# Patient Record
Sex: Female | Born: 1980 | Race: White | Hispanic: No | Marital: Married | Smoking: Former smoker
Health system: Southern US, Community
[De-identification: ages and names within clinical notes are randomized; demographics above are authoritative.]

## PROBLEM LIST (undated history)

## (undated) DIAGNOSIS — T753XXA Motion sickness, initial encounter: Secondary | ICD-10-CM

## (undated) DIAGNOSIS — J45909 Unspecified asthma, uncomplicated: Secondary | ICD-10-CM

## (undated) DIAGNOSIS — Z1589 Genetic susceptibility to other disease: Secondary | ICD-10-CM

## (undated) DIAGNOSIS — Z808 Family history of malignant neoplasm of other organs or systems: Secondary | ICD-10-CM

## (undated) DIAGNOSIS — Z803 Family history of malignant neoplasm of breast: Secondary | ICD-10-CM

## (undated) DIAGNOSIS — D682 Hereditary deficiency of other clotting factors: Secondary | ICD-10-CM

## (undated) DIAGNOSIS — E559 Vitamin D deficiency, unspecified: Secondary | ICD-10-CM

## (undated) HISTORY — PX: LAPAROSCOPIC ABDOMINAL EXPLORATION: SHX6249

## (undated) HISTORY — DX: Vitamin D deficiency, unspecified: E55.9

## (undated) HISTORY — DX: Family history of malignant neoplasm of other organs or systems: Z80.8

## (undated) HISTORY — PX: DILATION AND CURETTAGE OF UTERUS: SHX78

## (undated) HISTORY — DX: Family history of malignant neoplasm of breast: Z80.3

## (undated) HISTORY — PX: WISDOM TOOTH EXTRACTION: SHX21

## (undated) HISTORY — DX: Hereditary deficiency of other clotting factors: D68.2

---
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
## 2004-11-10 ENCOUNTER — Ambulatory Visit: Payer: Self-pay | Admitting: Internal Medicine

## 2008-08-18 ENCOUNTER — Emergency Department: Payer: Self-pay | Admitting: Emergency Medicine

## 2011-03-17 ENCOUNTER — Ambulatory Visit: Payer: Self-pay | Admitting: Unknown Physician Specialty

## 2011-03-21 ENCOUNTER — Ambulatory Visit: Payer: Self-pay | Admitting: Urology

## 2011-03-23 LAB — PATHOLOGY REPORT

## 2012-11-14 ENCOUNTER — Emergency Department: Payer: Self-pay | Admitting: Emergency Medicine

## 2012-11-14 LAB — URINALYSIS, COMPLETE
Bilirubin,UR: NEGATIVE
Blood: NEGATIVE
Glucose,UR: NEGATIVE mg/dL (ref 0–75)
Ketone: NEGATIVE
Leukocyte Esterase: NEGATIVE
Nitrite: NEGATIVE
Ph: 6 (ref 4.5–8.0)
Protein: NEGATIVE
RBC,UR: 1 /HPF (ref 0–5)
Specific Gravity: 1.019 (ref 1.003–1.030)
Squamous Epithelial: 1
WBC UR: 2 /HPF (ref 0–5)

## 2012-11-14 LAB — COMPREHENSIVE METABOLIC PANEL
Albumin: 3.9 g/dL (ref 3.4–5.0)
Alkaline Phosphatase: 86 U/L (ref 50–136)
Anion Gap: 5 — ABNORMAL LOW (ref 7–16)
BUN: 10 mg/dL (ref 7–18)
Bilirubin,Total: 0.3 mg/dL (ref 0.2–1.0)
Calcium, Total: 8.7 mg/dL (ref 8.5–10.1)
Chloride: 107 mmol/L (ref 98–107)
Co2: 29 mmol/L (ref 21–32)
Creatinine: 1.02 mg/dL (ref 0.60–1.30)
EGFR (African American): >60
EGFR (Non-African Amer.): >60
Glucose: 102 mg/dL — ABNORMAL HIGH (ref 65–99)
Osmolality: 280 (ref 275–301)
Potassium: 3.6 mmol/L (ref 3.5–5.1)
SGOT(AST): 26 U/L (ref 15–37)
SGPT (ALT): 32 U/L (ref 12–78)
Sodium: 141 mmol/L (ref 136–145)
Total Protein: 6.8 g/dL (ref 6.4–8.2)

## 2012-11-14 LAB — CBC
HCT: 38.8 % (ref 35.0–47.0)
HGB: 13.6 g/dL (ref 12.0–16.0)
MCH: 30.3 pg (ref 26.0–34.0)
MCHC: 35 g/dL (ref 32.0–36.0)
MCV: 87 fL (ref 80–100)
Platelet: 199 10*3/uL (ref 150–440)
RBC: 4.48 10*6/uL (ref 3.80–5.20)
RDW: 13 % (ref 11.5–14.5)
WBC: 10.7 10*3/uL (ref 3.6–11.0)

## 2014-04-24 ENCOUNTER — Observation Stay: Payer: Self-pay | Admitting: Obstetrics and Gynecology

## 2014-06-02 ENCOUNTER — Inpatient Hospital Stay: Payer: Self-pay

## 2014-06-02 LAB — CBC WITH DIFFERENTIAL/PLATELET
Basophil #: 0 10*3/uL (ref 0.0–0.1)
Basophil %: 0.3 %
Eosinophil #: 0 10*3/uL (ref 0.0–0.7)
Eosinophil %: 0.2 %
HCT: 33.2 % — ABNORMAL LOW (ref 35.0–47.0)
HGB: 10.6 g/dL — ABNORMAL LOW (ref 12.0–16.0)
Lymphocyte #: 1.1 10*3/uL (ref 1.0–3.6)
Lymphocyte %: 9.6 %
MCH: 27.9 pg (ref 26.0–34.0)
MCHC: 31.9 g/dL — ABNORMAL LOW (ref 32.0–36.0)
MCV: 88 fL (ref 80–100)
Monocyte #: 0.3 x10 3/mm (ref 0.2–0.9)
Monocyte %: 2.9 %
Neutrophil #: 10.2 10*3/uL — ABNORMAL HIGH (ref 1.4–6.5)
Neutrophil %: 87 %
Platelet: 136 10*3/uL — ABNORMAL LOW (ref 150–440)
RBC: 3.79 10*6/uL — ABNORMAL LOW (ref 3.80–5.20)
RDW: 14.9 % — ABNORMAL HIGH (ref 11.5–14.5)
WBC: 11.8 10*3/uL — ABNORMAL HIGH (ref 3.6–11.0)

## 2014-06-03 LAB — CBC WITH DIFFERENTIAL/PLATELET
Basophil #: 0 10*3/uL (ref 0.0–0.1)
Basophil %: 0.2 %
Eosinophil #: 0 10*3/uL (ref 0.0–0.7)
Eosinophil %: 0.1 %
HCT: 32.1 % — ABNORMAL LOW (ref 35.0–47.0)
HGB: 10.4 g/dL — ABNORMAL LOW (ref 12.0–16.0)
Lymphocyte #: 1 10*3/uL (ref 1.0–3.6)
Lymphocyte %: 6.9 %
MCH: 27.7 pg (ref 26.0–34.0)
MCHC: 32.4 g/dL (ref 32.0–36.0)
MCV: 86 fL (ref 80–100)
Monocyte #: 0.6 x10 3/mm (ref 0.2–0.9)
Monocyte %: 4.3 %
Neutrophil #: 13.2 10*3/uL — ABNORMAL HIGH (ref 1.4–6.5)
Neutrophil %: 88.5 %
Platelet: 131 10*3/uL — ABNORMAL LOW (ref 150–440)
RBC: 3.75 10*6/uL — ABNORMAL LOW (ref 3.80–5.20)
RDW: 14.9 % — ABNORMAL HIGH (ref 11.5–14.5)
WBC: 14.9 10*3/uL — ABNORMAL HIGH (ref 3.6–11.0)

## 2014-06-04 LAB — HEMOGLOBIN: HGB: 8.9 g/dL — ABNORMAL LOW (ref 12.0–16.0)

## 2014-08-11 IMAGING — CT CT HEAD WITHOUT CONTRAST
2 series · 16 of 30 positions shown, 20 images · non-contrast
Comparison: none

REASON FOR EXAM: slurred speech
COMMENTS:   LMP: > one month ago

PROCEDURE:     CT  - CT HEAD WITHOUT CONTRAST  - November 15, 2012  [DATE]
RESULT:     Comparison:  None
TECHNIQUE: Multiple axial images from the foramen magnum to the vertex were
obtained without IV contrast.

[Series 2: without · axial · non-contrast · 0.40mm/px · z∈[-62,+63]mm · 13 of 31 slices shown, 17 images]
[im 3/31  brain]
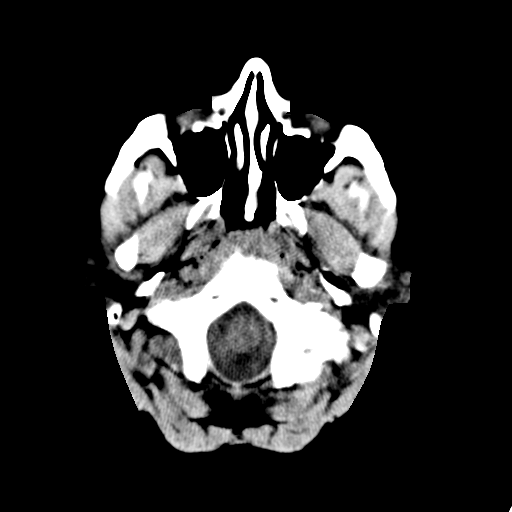
[im 3/31  bone]
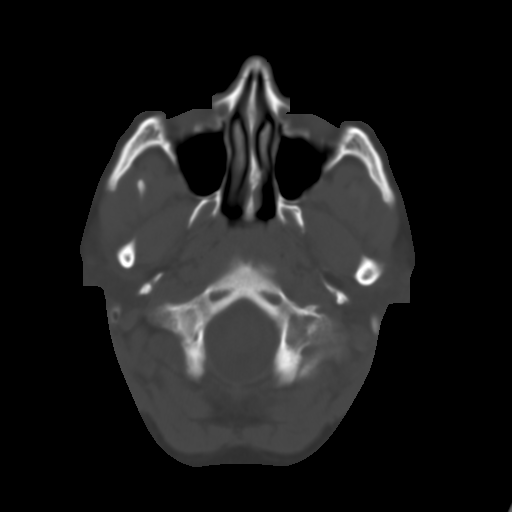
[im 5/31  brain]
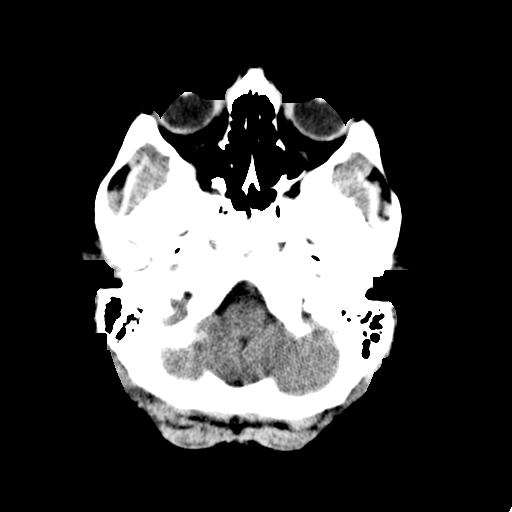
[im 7/31  brain]
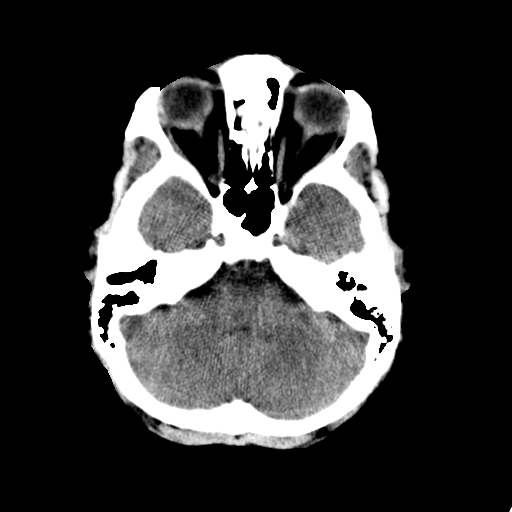
[im 9/31  brain]
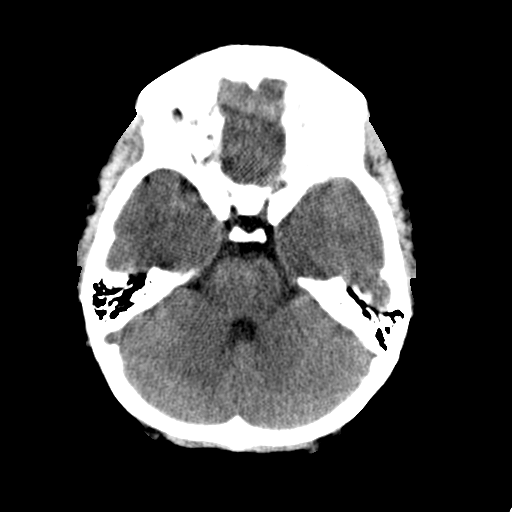
[im 11/31  brain]
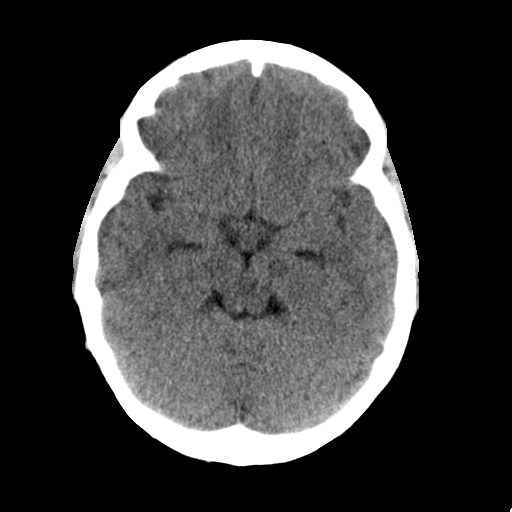
[im 11/31  bone]
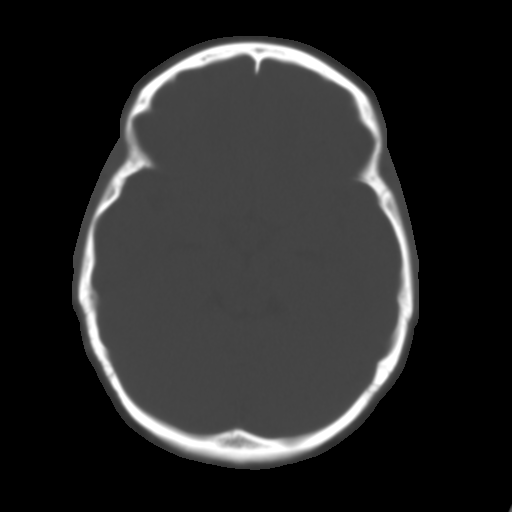
[im 13/31  brain]
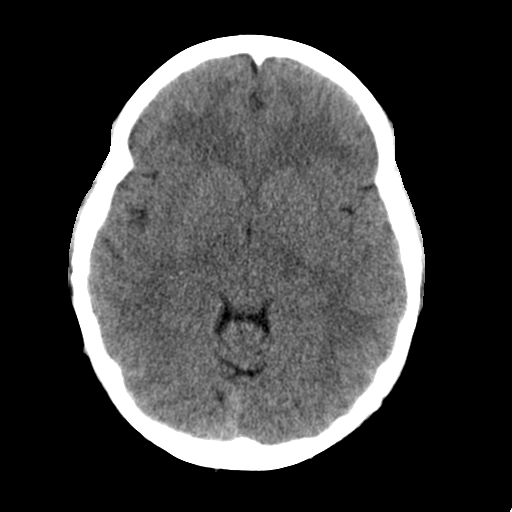
[im 16/31  brain]
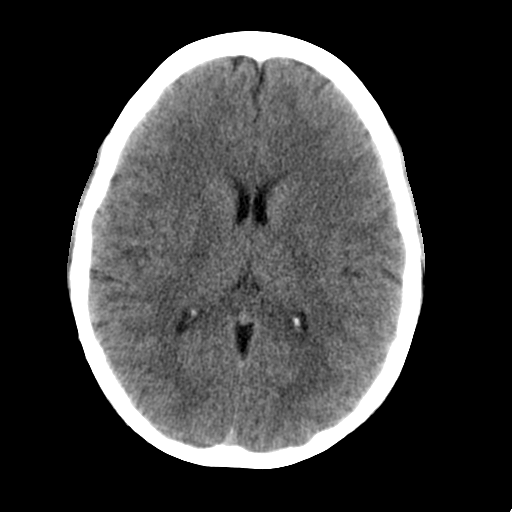
[im 18/31  brain]
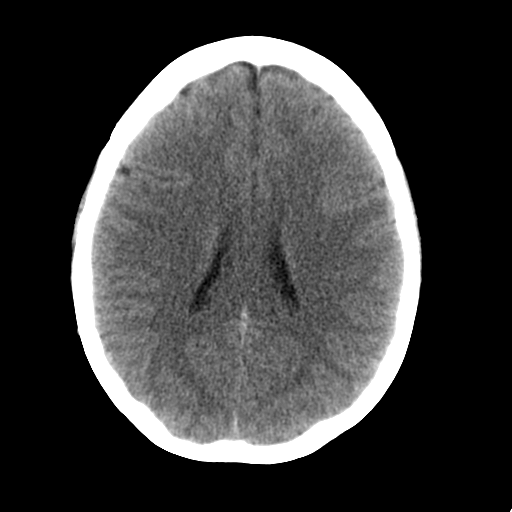
[im 20/31  brain]
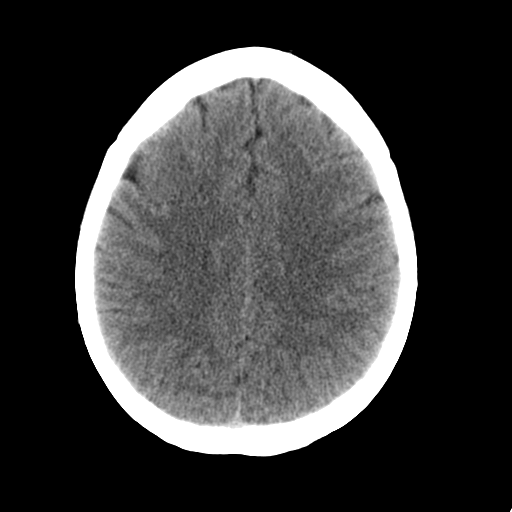
[im 20/31  bone]
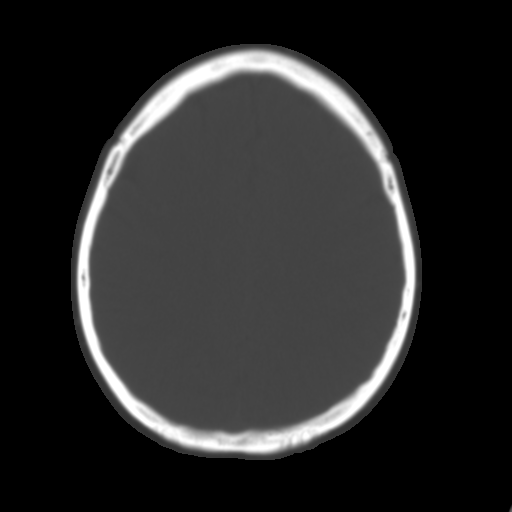
[im 22/31  brain]
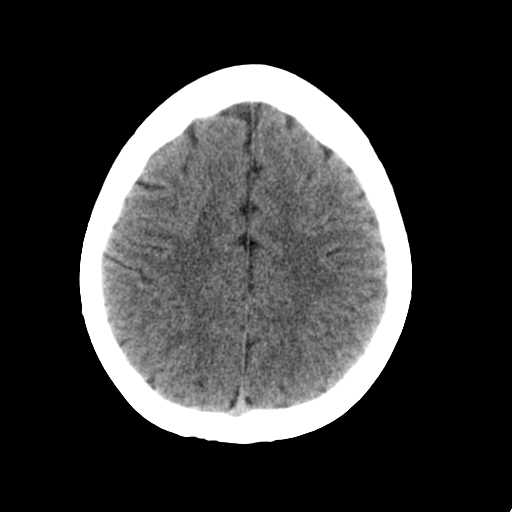
[im 24/31  brain]
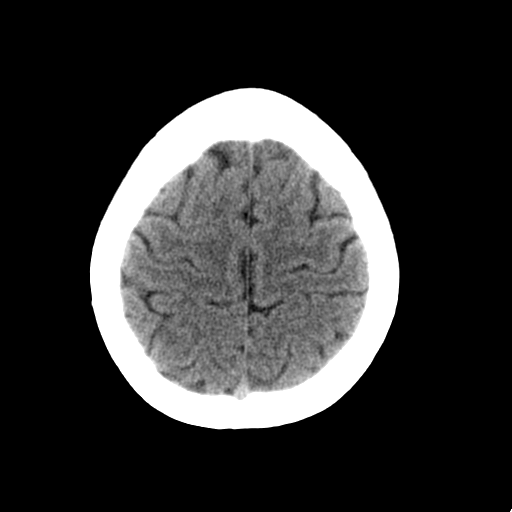
[im 26/31  brain]
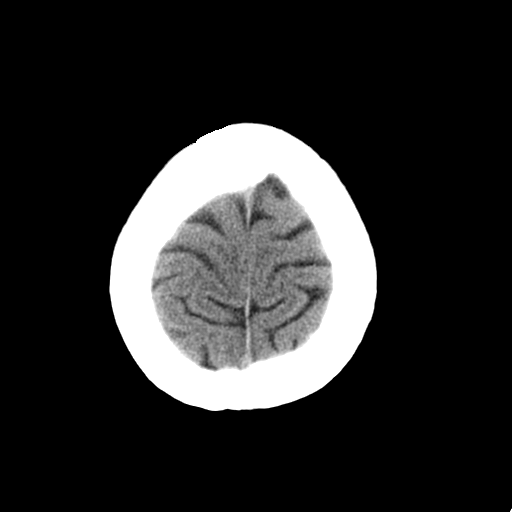
[im 28/31  brain]
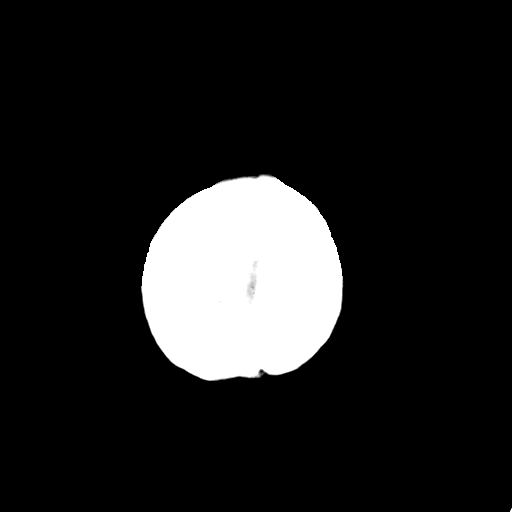
[im 28/31  bone]
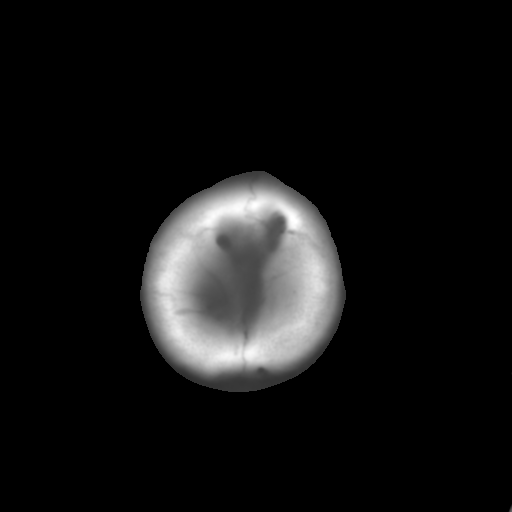

[Series 3: bone · axial · 0.40mm/px · z∈[-62,-22]mm · 3 of 31 slices shown]
[im 3/31  bone]
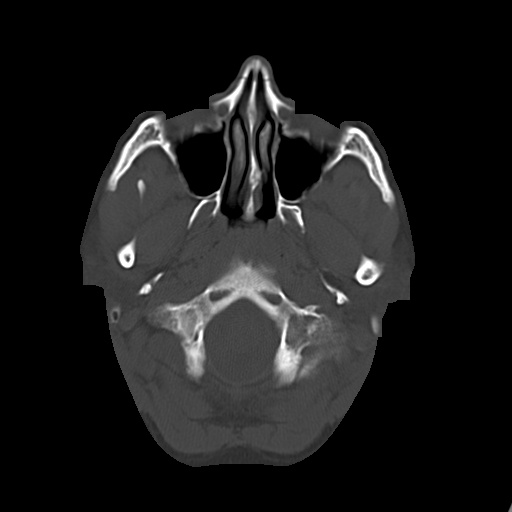
[im 7/31  bone]
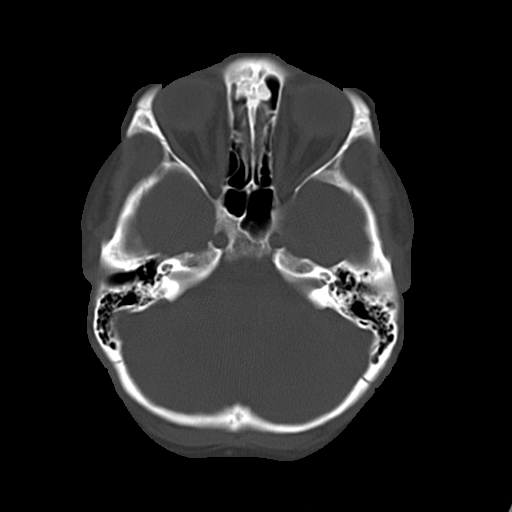
[im 11/31  bone]
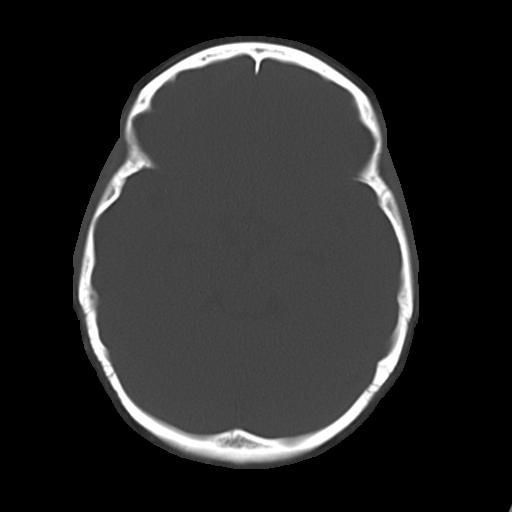

[16 of 30 positions shown; findings below may reference images not displayed]

FINDINGS: There is no evidence of mass effect, midline shift, or extra-axial fluid
collections.  There is no evidence of a space-occupying lesion or
intracranial hemorrhage. There is no evidence of a cortical-based area of
acute infarction.

The ventricles and sulci are appropriate for the patient's age. The basal
cisterns are patent.

Visualized portions of the orbits are unremarkable. The visualized portions
of the paranasal sinuses and mastoid air cells are unremarkable.

The osseous structures are unremarkable.
IMPRESSION: No acute intracranial process.

[REDACTED]

## 2014-09-20 NOTE — Op Note (Signed)
PATIENT NAME:  Joyce Lee, Joyce Lee MR#:  [id number] DATE OF BIRTH:  Aug 25, 1980  DATE OF PROCEDURE:  06/03/2014  PREOPERATIVE DIAGNOSES:  1. Intrauterine pregnancy at term.  2. Active labor.  3. Group B streptococcus negative.  4. Meconium-stained fluid.   POSTOPERATIVE DIAGNOSES: 1. Intrauterine pregnancy at term.  2. Active labor.  3. Group B streptococcus negative.  4. Meconium-stained fluid.  5. Delivery of viable female infant.  6. Mild postpartum hemorrhage with an estimated blood loss of 500 mL.   7. Retained fragment of placenta that was manually extracted at the bedside.  8. Occiput posterior position.   SURGEON: Cline CoolsBethany E. Andon Villard, MD.  ANESTHESIA:  Epidural.    COMPLICATIONS: Postpartum hemorrhage with estimated blood loss of 500 mL.    FINDINGS: A viable female infant with weight unknown at this time.  Apgars of 8 and 9 at one and five minutes respectively.  Postpartum hemorrhage with retained fragment of placenta 40 units of Pitocin given postpartum, as well as 800 mcg of rectal Cytotec given after delivery.   PROCEDURE: Ms. Chalmers GuestJamie Lee is a [age] year old gravida 3, para 0, 0-2-0 at 3939 and 4/7 weeks estimated gestational age who went into labor spontaneously. She presented in active labor and progressed to 8 cm dilated.  Her labor curve stalled at this point and Pitocin was started. She did progress to fully dilated in the next 10 hours. Her total time of rupture of membranes was 24 hours and she entered the second stage of labor.   She began to push over an intact perineum and pushed effectively. She delivered the fetal head in an occiput posterior position.  There was a nuchal cord that was unable to be reduced. Fetal shoulders were delivered promptly and a body nuchal cord was noted. The cord was doubly clamped and cut and a 3 vessel cord was noted. No stimulation of the baby was performed on the perineum and the baby was passed off to awaiting neonatal intensive care unit staff for  management.   The placenta was expressed and was intact.  Continued uterine bleeding was noted with a firm fundus.  Manual exploration of the uterus revealed a 7 x 7 cm portion of retained placenta and no membranes.  This portion of the placenta was easily extracted manually.  Vigorous bimanual massage confirmed minimal lochia.  Postpartum Pitocin was started and 800 mcg of Cytotec was given.  Total EBL was 500 mL.  Examination of the perineum and cervix, as well as the vaginal sidewalls revealed small lacerations that were mucosal in depth and not bleeding and no repair was indicated.    The patient tolerated these procedures well and is stable in the postpartum recovery room.   ____________________________ Cline CoolsBethany E. Jaxie Racanelli, MD beb:by D: 06/03/2014 16:25:33 ET T: 06/03/2014 17:25:27 ET JOB#: [id number]  cc: Cline CoolsBethany E. Navya Timmons, MD, <Dictator> Cline CoolsBETHANY E Hiren Peplinski MD ELECTRONICALLY SIGNED 06/16/2014 17:48

## 2014-09-29 NOTE — H&P (Signed)
L&D Evaluation:  History:  [facility] 34 y/[facility]3P0020 [facility] with regular contractions that began through the night and bloody show. Baby is active, denies leaking fluid. GBS negative. Care @ [facility].   Presents with contractions   Patient's Medical History No Chronic Illness   Patient's Surgical History none   Medications Pre Natal Vitamins   Allergies NKDA   Social History none   Family History Non-Contributory   ROS:  ROS All systems were reviewed.  HEENT, CNS, GI, GU, Respiratory, CV, Renal and Musculoskeletal systems were found to be normal.   Exam:  Vital Signs stable   Urine Protein not completed   General no apparent distress   Mental Status clear   Chest clear   Heart normal sinus rhythm   Abdomen gravid, non-tender   Estimated Fetal Weight Average for gestational age   Fetal Position vtx   Fundal Height term   Back no CVAT   Edema no edema   Reflexes 1+   Clonus negative   Pelvic no external lesions, 4cm 90% vtx @ -2 BOWI nl show per rext RN exam   Mebranes Intact   FHT normal rate with no decels, baseline 150's multiple accels avg variability audible fetal movement. Variable decels down to 120-130's @ peak of some Uc's while in rocker. Repositioned to right side in bed.   Fetal Heart Rate 150   Ucx regular, 3-4 mins 60 sec   Skin dry   Lymph no lymphadenopathy   Impression:  Impression early labor   Plan:  Plan EFM/NST, monitor contractions and for cervical change   Comments Admitted, explained what to expect with 1st baby. Birth Plan discussed, questions answered. Husband at bedside, suppportive. Requests IV pain meds, may request epidural.   Electronic Signatures: Albertina ParrLugiano, Samya Siciliano B (CNM)  (Signed 12-Jan-16 14:33)  Authored: L&D Evaluation   Last Updated: 12-Jan-16 14:33 by Albertina ParrLugiano, Orbie Grupe B (CNM)

## 2015-06-30 ENCOUNTER — Ambulatory Visit: Payer: Self-pay

## 2015-06-30 ENCOUNTER — Other Ambulatory Visit: Payer: Self-pay | Admitting: Adult Health

## 2015-06-30 ENCOUNTER — Ambulatory Visit
Admission: RE | Admit: 2015-06-30 | Discharge: 2015-06-30 | Disposition: A | Discharge status: Home / Self Care | Payer: No Typology Code available for payment source | Source: Ambulatory Visit | Attending: Adult Health | Admitting: Adult Health

## 2015-06-30 DIAGNOSIS — R1013 Epigastric pain: Secondary | ICD-10-CM

## 2016-03-29 ENCOUNTER — Inpatient Hospital Stay: Payer: Self-pay | Admitting: Oncology

## 2016-03-29 DIAGNOSIS — Z1379 Encounter for other screening for genetic and chromosomal anomalies: Secondary | ICD-10-CM | POA: Insufficient documentation

## 2016-04-18 NOTE — Progress Notes (Deleted)
  [facility]  [phone] [phone]  ID: Joyce Lee OB: February 03, 1981  MR#: [id number]  QIO#:[id number]#:[id number]  Patient Care Team: No Pcp Per Patient as PCP - General (General Practice)  CHIEF COMPLAINT: Genetic counseling and testing.  INTERVAL HISTORY: ***  REVIEW OF SYSTEMS:   ROS  As per HPI. Otherwise, a complete review of systems is negative.  PAST MEDICAL HISTORY: No past medical history on file.  PAST SURGICAL HISTORY: No past surgical history on file.  FAMILY HISTORY: No family history on file.  ADVANCED DIRECTIVES (Y/N):  N  HEALTH MAINTENANCE: Social History  Substance Use Topics  . Smoking status: Not on file  . Smokeless tobacco: Not on file  . Alcohol use Not on file     Colonoscopy:  PAP:  Bone density:  Lipid panel:  Allergies not on file  No current outpatient prescriptions on file.   No current facility-administered medications for this visit.     OBJECTIVE: There were no vitals filed for this visit.   There is no height or weight on file to calculate BMI.    ECOG FS:{CHL ONC [id number]}  General: Well-developed, well-nourished, no acute distress. Eyes: Pink conjunctiva, anicteric sclera. HEENT: Normocephalic, moist mucous membranes, clear oropharnyx. Lungs: Clear to auscultation bilaterally. Heart: Regular rate and rhythm. No rubs, murmurs, or gallops. Abdomen: Soft, nontender, nondistended. No organomegaly noted, normoactive bowel sounds. Musculoskeletal: No edema, cyanosis, or clubbing. Neuro: Alert, answering all questions appropriately. Cranial nerves grossly intact. Skin: No rashes or petechiae noted. Psych: Normal affect. Lymphatics: No cervical, calvicular, axillary or inguinal LAD.   LAB RESULTS:  Lab Results  Component Value Date   NA 141 11/14/2012   K 3.6 11/14/2012   CL 107 11/14/2012   CO2 29 11/14/2012   GLUCOSE 102 (H) 11/14/2012   BUN 10 11/14/2012   CREATININE 1.02  11/14/2012   CALCIUM 8.7 11/14/2012   PROT 6.8 11/14/2012   ALBUMIN 3.9 11/14/2012   AST 26 11/14/2012   ALT 32 11/14/2012   ALKPHOS 86 11/14/2012   BILITOT 0.3 11/14/2012   GFRNONAA >60 11/14/2012   GFRAA >60 11/14/2012    Lab Results  Component Value Date   WBC 14.9 (H) 06/03/2014   NEUTROABS 13.2 (H) 06/03/2014   HGB 8.9 (L) 06/04/2014   HCT 32.1 (L) 06/03/2014   MCV 86 06/03/2014   PLT 131 (L) 06/03/2014     STUDIES: No results found.  ASSESSMENT: Genetic counseling and testing.  PLAN:    1. Genetic counseling and testing:  Patient expressed understanding and was in agreement with this plan. She also understands that She can call clinic at any time with any questions, concerns, or complaints.   No matching staging information was found for the patient.  Jeralyn Ruthsimothy J Meeya Goldin, MD   04/18/2016 10:50 PM

## 2016-04-19 ENCOUNTER — Inpatient Hospital Stay: Payer: Commercial Managed Care - HMO | Admitting: Oncology

## 2017-02-09 IMAGING — US US ABDOMEN COMPLETE
1 series · 14 of 25 positions shown · non-contrast
Comparison: None.

CLINICAL DATA: Epigastric pain for 1 year, more severe over the
last 5 days. At least 2 episodes per month.

EXAM:
ABDOMEN ULTRASOUND COMPLETE

[Series 1: us abdomen complete · 0.23mm/px · 14 of 105 slices shown]
[im 1/105]
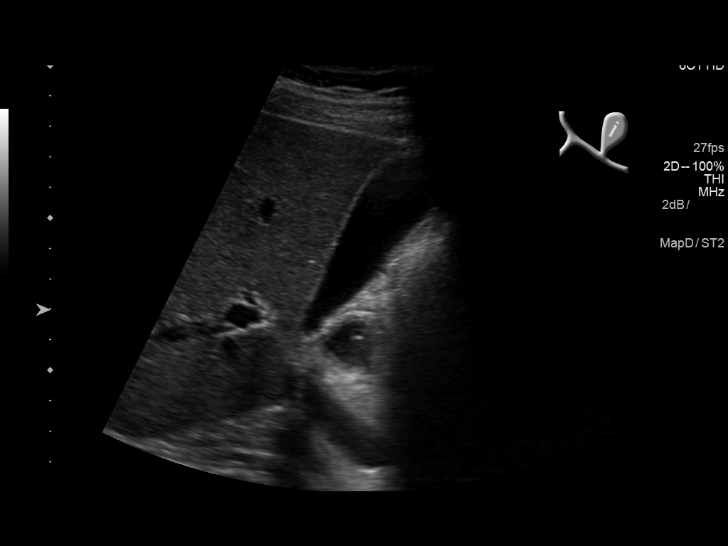
[im 9/105]
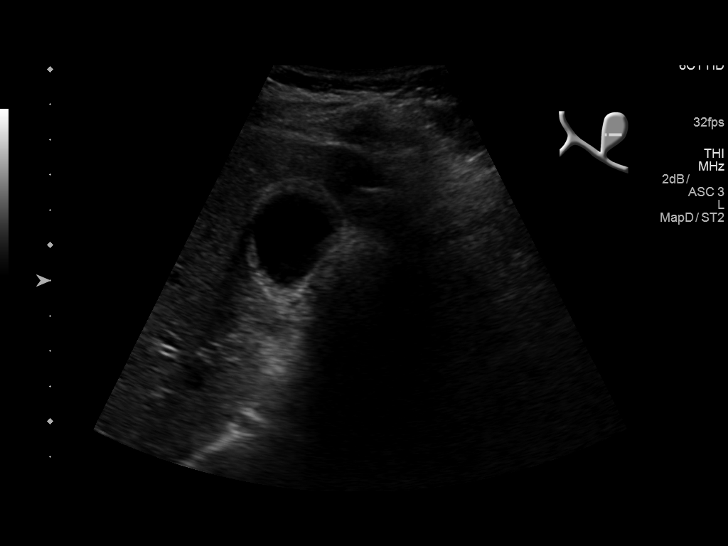
[im 18/105]
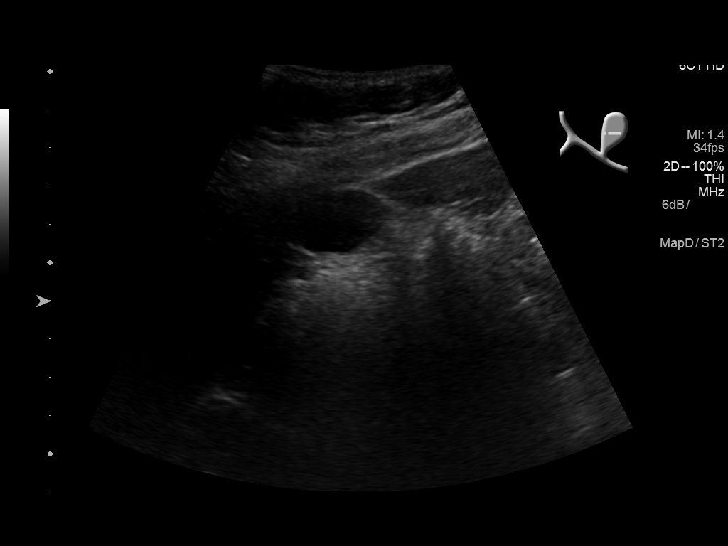
[im 27/105]
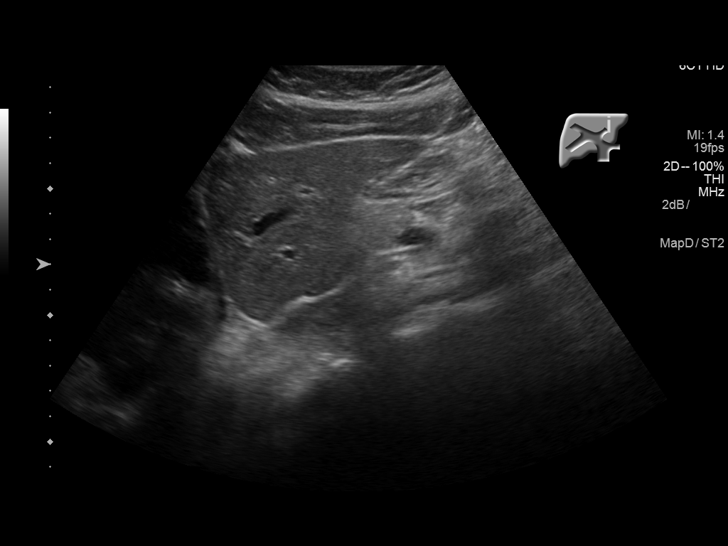
[im 35/105]
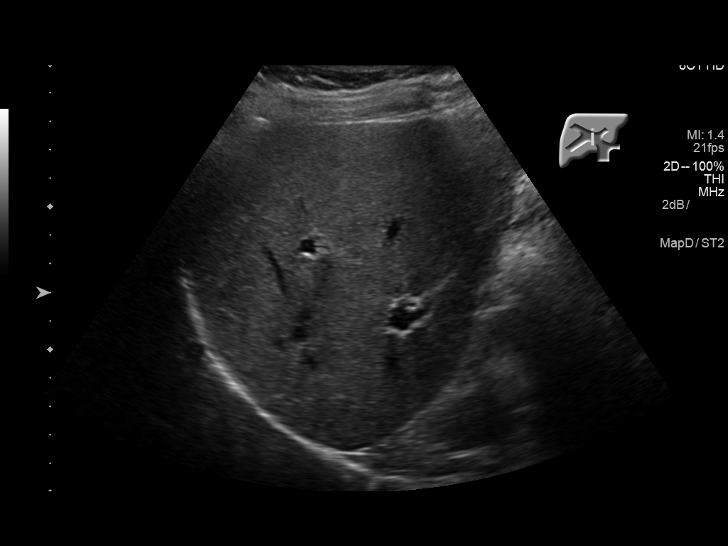
[im 40/105]
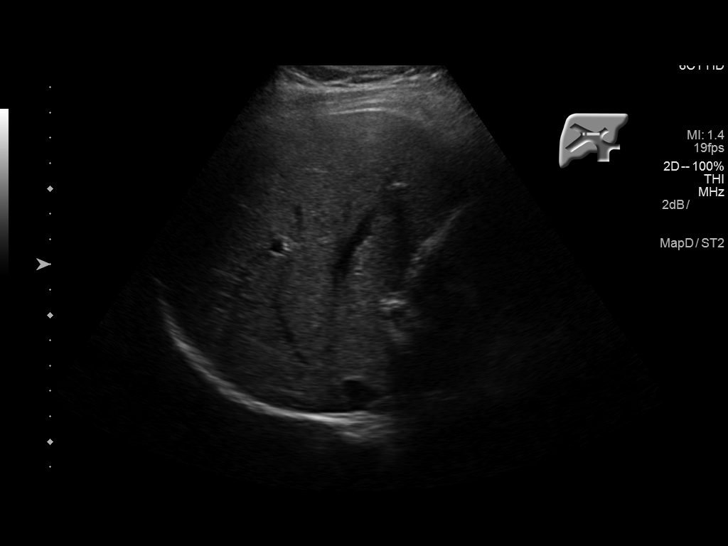
[im 48/105]
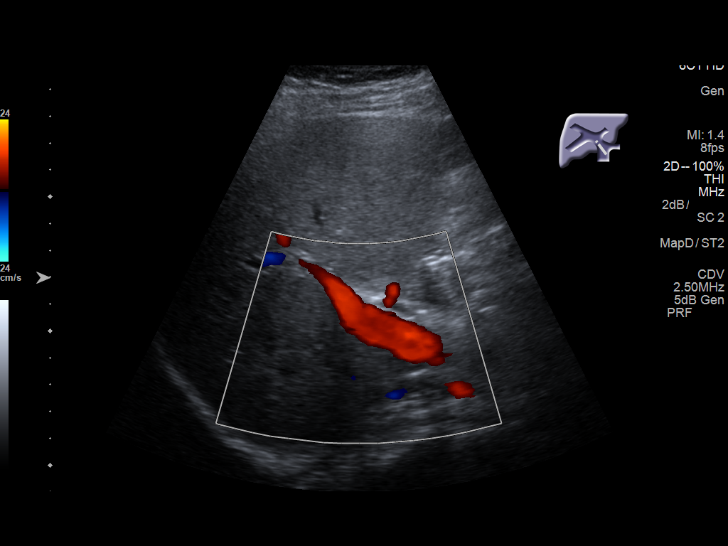
[im 57/105]
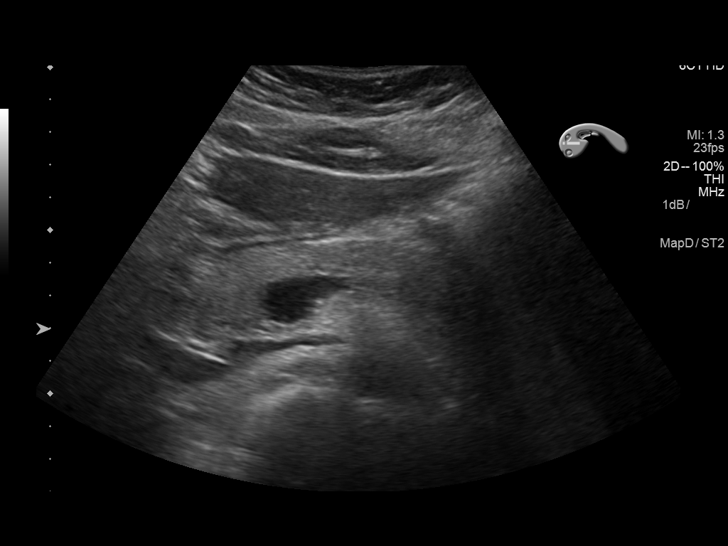
[im 66/105]
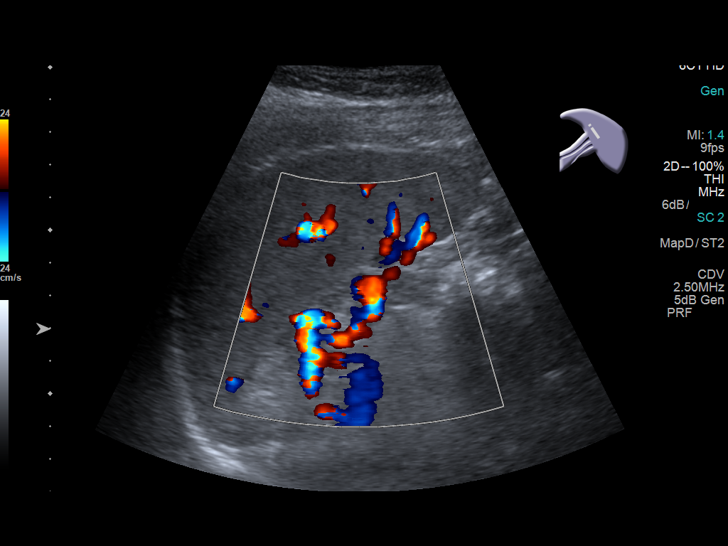
[im 70/105]
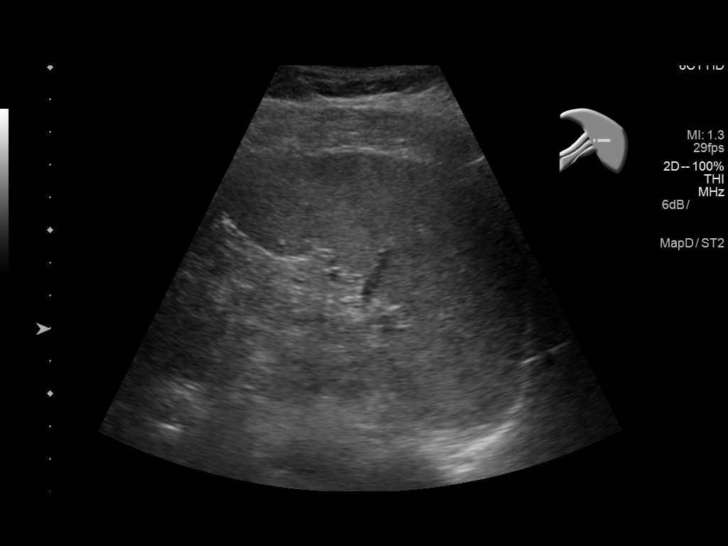
[im 79/105]
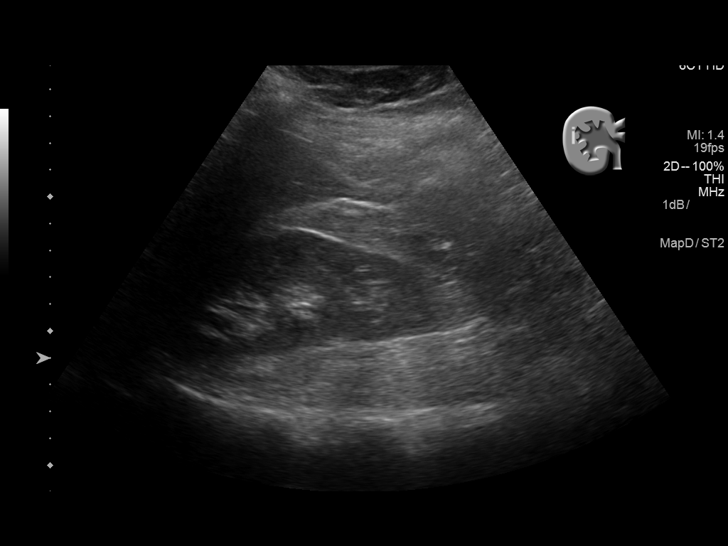
[im 87/105]
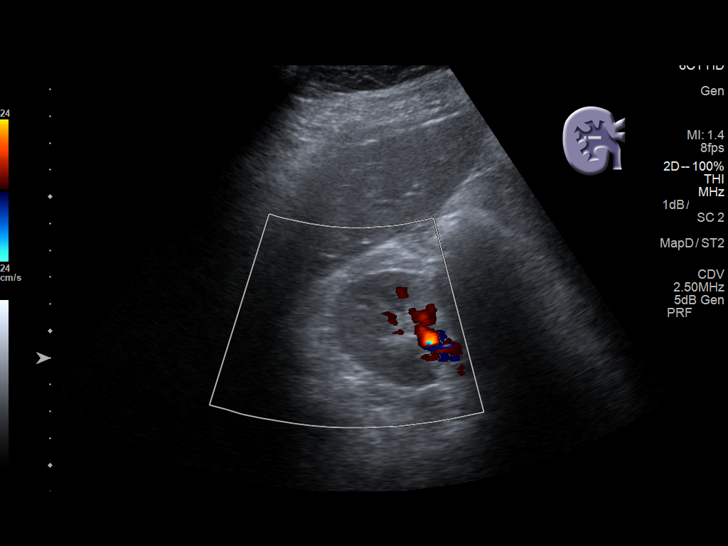
[im 96/105]
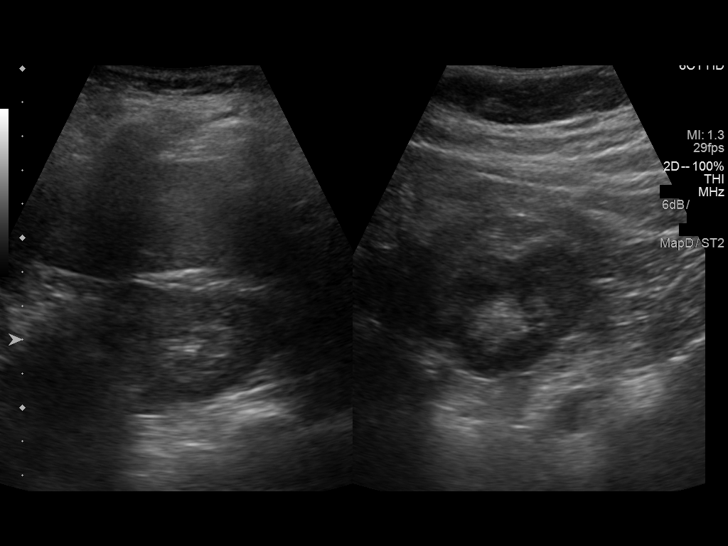
[im 105/105]
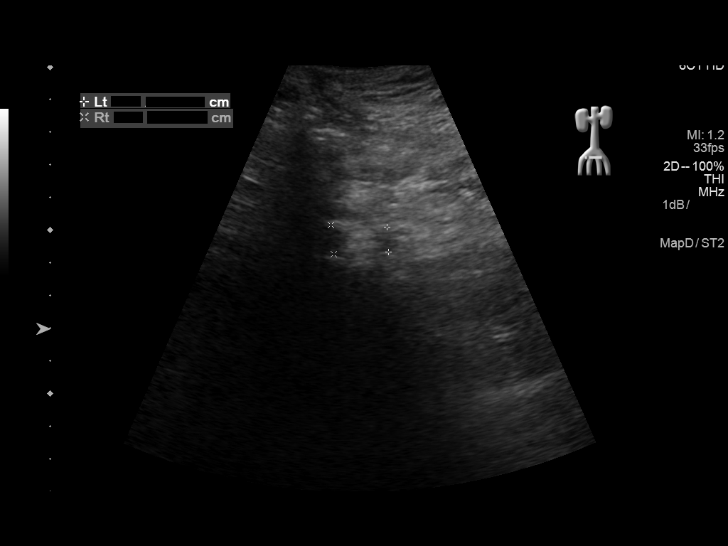

[14 of 25 positions shown; findings below may reference images not displayed]

FINDINGS: Gallbladder: No gallstones seen. There is no gallbladder wall
thickening, pericholecystic fluid or other secondary sonographic
signs of acute cholecystitis. No sonographic Murphy's sign elicited
during the examination, per the sonographer.

Common bile duct: Diameter: Normal at 5 mm.

Liver: No focal lesion identified. Within normal limits in
parenchymal echogenicity.

IVC: No abnormality visualized.

Pancreas: Visualized portion unremarkable.

Spleen: Size and appearance within normal limits.

Right Kidney: Length: 11.4 cm. Echogenicity within normal limits. No
mass or hydronephrosis visualized.

Left Kidney: Length: 10.3 cm. Echogenicity within normal limits. No
mass or hydronephrosis visualized.

Abdominal aorta: No aneurysm visualized.

Other findings: None.
IMPRESSION: Normal abdomen ultrasound.

## 2019-01-02 ENCOUNTER — Other Ambulatory Visit: Payer: Self-pay

## 2019-01-02 ENCOUNTER — Encounter: Payer: Self-pay | Admitting: Oncology

## 2019-01-02 ENCOUNTER — Inpatient Hospital Stay: Payer: Commercial Managed Care - PPO | Attending: Oncology | Admitting: Oncology

## 2019-01-02 ENCOUNTER — Encounter (INDEPENDENT_AMBULATORY_CARE_PROVIDER_SITE_OTHER): Payer: Self-pay

## 2019-01-02 VITALS — BP 104/62 | HR 74 | Temp 97.4°F | Resp 16 | Wt 161.9 lb

## 2019-01-02 DIAGNOSIS — E559 Vitamin D deficiency, unspecified: Secondary | ICD-10-CM | POA: Diagnosis not present

## 2019-01-02 DIAGNOSIS — D6851 Activated protein C resistance: Secondary | ICD-10-CM | POA: Diagnosis not present

## 2019-01-02 DIAGNOSIS — N941 Unspecified dyspareunia: Secondary | ICD-10-CM | POA: Insufficient documentation

## 2019-01-02 NOTE — Progress Notes (Signed)
Pt is here as new pt for Factor V leiden positive. Mom has it also. Pt has taken birth control for 20 years and only 1-2 times a year she would come off the pill to have menstrual cycle. Had to see fertility md to get pregnant. She has 1 daughter who is 4.

## 2019-01-03 ENCOUNTER — Encounter: Payer: Self-pay | Admitting: Oncology

## 2019-01-03 NOTE — Progress Notes (Signed)
Hematology/Oncology Consult note [facility] [phone] [phone]  Patient Care Team: Patient, No Pcp Per as PCP - General (General Practice)   Name of the patient: Joyce Lee  [id number]  September 03, 1980    Reason for referral-factor V Leiden heterozygosity.  No personal history of DVT or PE   Referring physician- Dr. Jenny Reichmann  History of presenting illness: Patient is a 38 year old female who was recently found to have heterozygosity for factor V Leiden mutation.  Patient has no personal history of DVT or PE.  Her mother had a DVT of her right lower extremity recently and underwent hypercoagulable work-up which showed that she had factor V Leiden mutation.  Is unclear if her mother was homozygous or heterozygous.  Patient got tested because of mother's positive results.  Patient has been on birth control for many years in the past but did not have any thrombotic complications.  She also states that when she used to get her menstrual cycles she needed birth control to allow for withdrawal bleeding.  She underwent ovulation induction as well to get pregnant.  She did not have any pregnancy or postpartum complications.  Patient does not desire any further children at this time.  She has been having painful intercourse since her pregnancy and has been seeing Dr. Leafy Ro for the same.  Consideration was being made to put her on estrogen pills to see if that would improve her dyspareunia.  However patient is currently not taking any hormone therapy at this time.  Besides her mother there is no other known family history of thrombosis.  She denies other complaints at this time  Date of visit: 01/03/19    ECOG PS- 0  Pain scale- 0   Review of systems- Review of Systems  Constitutional: Negative for chills, fever, malaise/fatigue and weight loss.  HENT: Negative for congestion, ear discharge and nosebleeds.   Eyes: Negative for blurred vision.   Respiratory: Negative for cough, hemoptysis, sputum production, shortness of breath and wheezing.   Cardiovascular: Negative for chest pain, palpitations, orthopnea and claudication.  Gastrointestinal: Negative for abdominal pain, blood in stool, constipation, diarrhea, heartburn, melena, nausea and vomiting.  Genitourinary: Negative for dysuria, flank pain, frequency, hematuria and urgency.  Musculoskeletal: Negative for back pain, joint pain and myalgias.  Skin: Negative for rash.  Neurological: Negative for dizziness, tingling, focal weakness, seizures, weakness and headaches.  Endo/Heme/Allergies: Does not bruise/bleed easily.  Psychiatric/Behavioral: Negative for depression and suicidal ideas. The patient does not have insomnia.     No Known Allergies  Patient Active Problem List   Diagnosis Date Noted  . Genetic testing of female 03/29/2016     Past Medical History:  Diagnosis Date  . Factor V deficiency ([place])   . Vitamin D deficiency      History reviewed. No pertinent surgical history.  Social History   Socioeconomic History  . Marital status: Married    Spouse name: Not on file  . Number of children: Not on file  . Years of education: Not on file  . Highest education level: Not on file  Occupational History  . Not on file  Social Needs  . Financial resource strain: Not on file  . Food insecurity    Worry: Not on file    Inability: Not on file  . Transportation needs    Medical: Not on file    Non-medical: Not on file  Tobacco Use  . Smoking status: Never Smoker  . Smokeless tobacco: Never  Used  Substance and Sexual Activity  . Alcohol use: Not Currently    Comment: rare once or twice a year  . Drug use: Not on file  . Sexual activity: Yes  Lifestyle  . Physical activity    Days per week: Not on file    Minutes per session: Not on file  . Stress: Not on file  Relationships  . Social Musicianconnections    Talks on phone: Not on file    Gets together: Not  on file    Attends religious service: Not on file    Active member of club or organization: Not on file    Attends meetings of clubs or organizations: Not on file    Relationship status: Not on file  . Intimate partner violence    Fear of current or ex partner: Not on file    Emotionally abused: Not on file    Physically abused: Not on file    Forced sexual activity: Not on file  Other Topics Concern  . Not on file  Social History Narrative  . Not on file     Family History  Problem Relation Age of Onset  . Factor V Leiden deficiency Mother      Current Outpatient Medications:  Marland Kitchen.  Vitamin D, Ergocalciferol, (DRISDOL) 1.25 MG (50000 UT) CAPS capsule, Take 50,000 Units by mouth every 7 (seven) days., Disp: , Rfl:    Physical exam:  Vitals:   01/02/19 1518  BP: 104/62  Pulse: 74  Resp: 16  Temp: (!) 97.4 F (36.3 C)  TempSrc: Tympanic  Weight: 161 lb 14.4 oz (73.4 kg)   Physical Exam Constitutional:      General: She is not in acute distress. HENT:     Head: Normocephalic and atraumatic.  Eyes:     Pupils: Pupils are equal, round, and reactive to light.  Neck:     Musculoskeletal: Normal range of motion.  Cardiovascular:     Rate and Rhythm: Normal rate and regular rhythm.     Heart sounds: Normal heart sounds.  Pulmonary:     Effort: Pulmonary effort is normal.     Breath sounds: Normal breath sounds.  Abdominal:     General: Bowel sounds are normal.     Palpations: Abdomen is soft.  Skin:    General: Skin is warm and dry.  Neurological:     Mental Status: She is alert and oriented to person, place, and time.        CMP Latest Ref Rng & Units 11/14/2012  Glucose 65 - 99 mg/dL 161(W102(H)  BUN 7 - 18 mg/dL 10  Creatinine 9.600.60 - 4.541.30 mg/dL 0.981.02  Sodium 119136 - 147145 mmol/L 141  Potassium 3.5 - 5.1 mmol/L 3.6  Chloride 98 - 107 mmol/L 107  CO2 21 - 32 mmol/L 29  Calcium 8.5 - 10.1 mg/dL 8.7  Total Protein 6.4 - 8.2 g/dL 6.8  Total Bilirubin 0.2 - 1.0 mg/dL  0.3  Alkaline Phos 50 - 136 Unit/L 86  AST 15 - 37 Unit/L 26  ALT 12 - 78 U/L 32   CBC Latest Ref Rng & Units 06/04/2014  WBC 3.6 - 11.0 x10 3/mm 3 -  Hemoglobin 12.0 - 16.0 g/dL 8.2(N8.9(L)  Hematocrit 56.235.0 - 47.0 % -  Platelets 150 - 440 x10 3/mm 3 -    Assessment and plan- Patient is a 38 y.o. female found to have factor V Leiden heterozygosity referred for further management  Patient does not have  any personal history of DVT and PE.  She therefore does not require any anticoagulation at this time for factor V Leiden heterozygosity.  Discussed that heterozygosity for factor V Leiden Does carry a relative risk of venous thrombosis 4- 8 fold as compared to general population.  Recommend that she should take general precautions including not taking up smoking in the future.  Keeping her BMI normal, frequent ambulation as much as possible during long distance trips are some of the other thing that she could do to reduce the risk of DVT and PE.  The role of low-dose aspirin is controversial and may be considered  during long distance trips or air travel.  Future pregnancies, use of hormone contraception can further increase her risk of DVT and PE.  As such use of oral contraception although not contraindicated would be discouraged in her situation.  If patient were to decide to get pregnant in the future consideration needs to be given for postpartum prophylaxis given her family history of DVT in her mother.  Patient had antiphospholipid antibody syndrome testing which was negative.  I do not recommend extensive hypercoagulable work-up for the patient in the absence of personal history but this is something that her mother could consider getting done after appropriate discussion with her physician  Patient does not require hematology follow-up at this time   Thank you for this kind referral and the opportunity to participate in the care of this patient   Visit Diagnosis 1. Factor V Leiden carrier  (HCC)     Dr. Owens SharkArchana Alfonza Toft, MD, MPH [facility] [phone] 01/03/2019  1:32 PM

## 2019-12-25 ENCOUNTER — Telehealth: Payer: Self-pay | Admitting: *Deleted

## 2019-12-25 NOTE — Telephone Encounter (Signed)
[facility] sent a secure chat and it said:Pt called to wanting to RTC to see Dr. Smith Robert pt was last seen on 04/11/19.Marland Kitchen pt is requesting to have a Nurse give her a call she has some questions that needs answering.. [phone].  I called pt and got her voicemail and left her a message to call me and speak about her questions and if she needs visit. I left her a message to call me back directly

## 2019-12-25 NOTE — Telephone Encounter (Signed)
Pt called back and said that she wants to know if dr Smith Robert would write a letter for her to not have the covid vaccine. She feels that there is not enough studies/ info to say that it is safe for people to take vaccine with dx of FactorIV leiden mutation. I told her she was on vacation until 8/12. I will send her this message but she might not get a call on the first day she is coming back from vacation due to the schedule being full that day. She is fine with this. She did say that she feels it should be individual choice when it comes to each person's body.

## 2020-01-05 ENCOUNTER — Telehealth: Payer: Self-pay | Admitting: *Deleted

## 2020-01-05 NOTE — Telephone Encounter (Signed)
Pt called last week while Smith Robert was on vacation and pt wanted to know if she could not get vaccine. She was concerned about the factor V leiden and wanted to see if she could get a letter for not taking it. I called her today and per rao it is not contraindicated with people who have factor V leiden. She rec: the pt to take it . The pt does not want it and her work is not making it mandatory at this time

## 2021-07-06 DIAGNOSIS — R102 Pelvic and perineal pain: Secondary | ICD-10-CM | POA: Diagnosis not present

## 2021-07-06 DIAGNOSIS — N946 Dysmenorrhea, unspecified: Secondary | ICD-10-CM | POA: Diagnosis not present

## 2021-07-06 DIAGNOSIS — M545 Low back pain, unspecified: Secondary | ICD-10-CM | POA: Diagnosis not present

## 2021-07-06 DIAGNOSIS — N944 Primary dysmenorrhea: Secondary | ICD-10-CM | POA: Diagnosis not present

## 2021-07-06 DIAGNOSIS — R140 Abdominal distension (gaseous): Secondary | ICD-10-CM | POA: Diagnosis not present

## 2021-07-19 ENCOUNTER — Other Ambulatory Visit: Payer: Self-pay | Admitting: Obstetrics and Gynecology

## 2021-07-19 ENCOUNTER — Other Ambulatory Visit: Payer: Self-pay

## 2021-07-19 ENCOUNTER — Encounter (INDEPENDENT_AMBULATORY_CARE_PROVIDER_SITE_OTHER): Payer: Self-pay

## 2021-07-19 ENCOUNTER — Inpatient Hospital Stay: Payer: 59

## 2021-07-19 ENCOUNTER — Inpatient Hospital Stay: Payer: 59 | Attending: Oncology | Admitting: Licensed Clinical Social Worker

## 2021-07-19 ENCOUNTER — Encounter: Payer: Self-pay | Admitting: Licensed Clinical Social Worker

## 2021-07-19 DIAGNOSIS — Z808 Family history of malignant neoplasm of other organs or systems: Secondary | ICD-10-CM | POA: Diagnosis not present

## 2021-07-19 DIAGNOSIS — Z803 Family history of malignant neoplasm of breast: Secondary | ICD-10-CM

## 2021-07-19 NOTE — Progress Notes (Signed)
REFERRING PROVIDER: Benjaman Kindler, [address] [place],  [place] [address]  PRIMARY PROVIDER:  Patient, No Pcp Per (Inactive)  PRIMARY REASON FOR VISIT:  1. Family history of breast cancer   2. Family history of melanoma      HISTORY OF PRESENT ILLNESS:   Joyce Lee, a 41 y.o. female, was seen for a [facility] cancer genetics consultation at the request of Dr. Leafy Ro due to a family history of cancer.  Joyce Lee presents to clinic today to discuss the possibility of a hereditary predisposition to cancer, genetic testing, and to further clarify her future cancer risks, as well as potential cancer risks for family members.   Joyce Lee is a 41 y.o. female with no personal history of cancer.  She is having surgery on 3/20 for a uterine growth and would like to use genetic test results to determine if she needs more extensive surgery.   CANCER HISTORY:  Oncology History   No history exists.     RISK FACTORS:  Menarche was at age 34.  First live birth at age 38.  Ovaries intact: yes.  Hysterectomy: no.  Menopausal status: premenopausal.  HRT use: 0 years. Colonoscopy: yes;  many years ago. Reportedly normal.  . Mammogram within the last year: no. Number of breast biopsies: 0. Up to date with pelvic exams: yes.  Past Medical History:  Diagnosis Date   Factor V deficiency ([place])    Family history of breast cancer    Family history of melanoma    Vitamin D deficiency     Social History   Socioeconomic History   Marital status: Married    Spouse name: Not on file   Number of children: Not on file   Years of education: Not on file   Highest education level: Not on file  Occupational History   Not on file  Tobacco Use   Smoking status: Never   Smokeless tobacco: Never  Vaping Use   Vaping Use: Every day  Substance and Sexual Activity   Alcohol use: Not Currently    Comment: rare once or twice a year   Drug use: Not on file   Sexual activity: Yes  Other  Topics Concern   Not on file  Social History Narrative   Not on file   Social Determinants of Health   Financial Resource Strain: Not on file  Food Insecurity: Not on file  Transportation Needs: Not on file  Physical Activity: Not on file  Stress: Not on file  Social Connections: Not on file     FAMILY HISTORY:  We obtained a detailed, 4-generation family history.  Significant diagnoses are listed below: Family History  Problem Relation Age of Onset   Factor V Leiden deficiency Mother    Melanoma Father        x3 or more   Skin cancer Father        many SCC + BCC   Breast cancer Maternal Aunt        dx 1s-50s   Breast cancer Paternal Aunt        dx 81s   Skin cancer Paternal Grandfather    Joyce Lee has 1 daughter, 7. She has 1 sister, 73, no history of cancer.  Joyce Lee mother is living at 64 and has not had cancer. Patient has 2 maternal aunts, 1 uncle. One aunt had breast cancer in her 34s-50s. No known cousins with cancer. Maternal grandmother is living at 17, grandfather died  in his late 71s in an accident.  Joyce Lee father is 62 and has had more than 3 melanomas removed and many basal cell and squamous cell carcinomas of his skin removed. Patient has 1 paternal aunt and she had breast cancer in her 19s. Patient does not have paternal first cousins. Paternal grandmother died at 52 of a heart attack, grandfather died of skin cancer in his 2s.  Ms. Pavone is unaware of previous family history of genetic testing for hereditary cancer risks. Patient's maternal ancestors are of [place] descent, and paternal ancestors are of Scottish/[place] descent. There is no reported Ashkenazi Jewish ancestry. There is no known consanguinity.     GENETIC COUNSELING ASSESSMENT: Joyce Lee is a 41 y.o. female with a family history of breast cancer which is somewhat suggestive of a hereditary cancer syndrome and predisposition to cancer. We, therefore,  discussed and recommended the following at today's visit.   DISCUSSION: We discussed that approximately 10% of breast cancer is hereditary. Most cases of hereditary breast cancer are associated with BRCA1/BRCA2 genes, although there are other genes associated with hereditary cancer as well. Cancers and risks are gene specific.  We discussed that testing is beneficial for several reasons including knowing about cancer risks, identifying potential screening and risk-reduction options that may be appropriate, and to understand if other family members could be at risk for cancer and allow them to undergo genetic testing.   We reviewed the characteristics, features and inheritance patterns of hereditary cancer syndromes. We also discussed genetic testing, including the appropriate family members to test, the process of testing, insurance coverage and turn-around-time for results. We discussed the implications of a negative, positive and/or variant of uncertain significant result. We recommended Joyce Lee pursue genetic testing for the [facility] Breast Cancer STAT panel (so she can have testing back in time to help with surgical decision) + Multi-Cancer+RNA gene panel.   The STAT Breast cancer panel offered by [facility] includes sequencing and rearrangement analysis for the following 9 genes:  ATM, BRCA1, BRCA2, CDH1, CHEK2, PALB2, PTEN, STK11 and TP53.    The Multi-Cancer Panel + RNA offered by [facility] includes sequencing and/or deletion duplication testing of the following 84 genes: AIP, ALK, APC, ATM, AXIN2,BAP1,  BARD1, BLM, BMPR1A, BRCA1, BRCA2, BRIP1, CASR, CDC73, CDH1, CDK4, CDKN1B, CDKN1C, CDKN2A (p14ARF), CDKN2A (p16INK4a), CEBPA, CHEK2, CTNNA1, DICER1, DIS3L2, EGFR (c.2369C>T, p.Thr790Met variant only), EPCAM (Deletion/duplication testing only), FH, FLCN, GATA2, GPC3, GREM1 (Promoter region deletion/duplication testing only), HOXB13 (c.251G>A, p.Gly84Glu), HRAS, KIT, MAX, MEN1, MET, MITF (c.952G>A,  p.Glu318Lys variant only), MLH1, MSH2, MSH3, MSH6, MUTYH, NBN, NF1, NF2, NTHL1, PALB2, PDGFRA, PHOX2B, PMS2, POLD1, POLE, POT1, PRKAR1A, PTCH1, PTEN, RAD50, RAD51C, RAD51D, RB1, RECQL4, RET, RUNX1, SDHAF2, SDHA (sequence changes only), SDHB, SDHC, SDHD, SMAD4, SMARCA4, SMARCB1, SMARCE1, STK11, SUFU, TERC, TERT, TMEM127, TP53, TSC1, TSC2, VHL, WRN and WT1.  Based on Joyce Lee's family history of cancer, she meets medical criteria for genetic testing. Despite that she meets criteria, she may have an out of pocket cost.  We discussed that some people do not want to undergo genetic testing due to fear of genetic discrimination.  A federal law called the Genetic Information Non-Discrimination Act (GINA) of 2008 helps protect individuals against genetic discrimination based on their genetic test results.  It impacts both health insurance and employment.  For health insurance, it protects against increased premiums, being kicked off insurance or being forced to take a test in order to be insured.  For employment it protects  against hiring, firing and promoting decisions based on genetic test results.  Health status due to a cancer diagnosis is not protected under GINA.  This law does not protect life insurance, disability insurance, or other types of insurance.   PLAN: After considering the risks, benefits, and limitations, Joyce Lee provided informed consent to pursue genetic testing and the blood sample was sent to [facility] for analysis of the [facility] Breast Cancer STAT + Multi-Cancer+RNA panel. Initial results should be available within approximately 5-12 days time, at which point they will be disclosed by telephone to Joyce Lee, as will any additional recommendations warranted by these results. Joyce Lee will receive a summary of her genetic counseling visit and a copy of her results once available. This information will also be available in Epic.   Joyce Lee questions were answered to her satisfaction  today. Our contact information was provided should additional questions or concerns arise. Thank you for the referral and allowing [place] to share in the care of your patient.   Faith Rogue, MS, [facility] Genetic Counselor [email or web address] Phone: [phone]  The patient was seen for a total of 30 minutes in face-to-face genetic counseling.  Dr. Grayland Ormond was available for discussion regarding this case.   _______________________________________________________________________ For Office Staff:  Number of people involved in session: 1 Was an Intern/ student involved with case: no

## 2021-07-27 NOTE — H&P (Signed)
?Chief Complaint: ? ?Joyce Lee is a 41 y.o. female presenting with Pre Op Consulting ? on 07/27/2021 ? ?History of Present Illness: ?Patient returns today for preoperative exam prior to her Dx laparoscopy with bilateral cystectomies and possible excision of endometriosis, possible LOA, cystoscopy with hydrodistension due to bilateral ovarian cysts, pelvic pain and back pain.  ? ?Today:  ?She also has genetic testing pending ?She also requests permanent sterilization ?  ?TVUS 06/2021 ?Uterus=7.51 x 4.61 x 5.10cm ?Endometrium=12.88mm ?Rt complex septated ovarian cyst=4cm; septations=0.44cm, 0.22cm, 0.16cm, 0.26cm, 1.45cm ?Doppler performed on Rt ovary ?Rt ovary volume=29.19ml ?Lt complex ovarian cyst with septation=1.1cm; septation=0.09cm ?Free fluid seen near Rt ovary ?  ?  ? ? ?  ? ?  ?Pertinent hx:  ?- Last pap ASCUS/HPVneg ?- Hx of anovulation with hx of infertility treatments for last delivery ?-Hx of NFP and diaphragm- was considering pregnancy next year ?-strong fhx of breast cancer in father's sisters, and breast cancer in maternal grandparent's side, and is interested in genetic cancer screening ?- Hx of dx lap in 2015  ? ?Past Medical History:  has a past medical history of Abnormal Pap smear, Amenorrhea, Depression, Hyperlipidemia, Infertility, female, unspecified, Interstitial cystitis, Migraines, and Obesity.  ?Past Surgical History:  has a past surgical history that includes laparoscopic surgery and Dilation and curettage, diagnostic / therapeutic (06-16-2013). ?Family History: family history includes Depression in her father and mother; Diabetes type II in her maternal grandmother; High blood pressure (Hypertension) in her father; Hyperlipidemia (Elevated cholesterol) in her father, maternal grandmother, and mother; Obesity in her mother; Skin cancer in her father; Thyroid disease in her mother. ?Social History:  reports that she has never smoked. She has never used smokeless tobacco. She reports that  she does not drink alcohol and does not use drugs. ?OB/GYN History:  ?OB History  ?  ?Gravida ?3 ?  ?Para ?1 ?  ?Term ?1 ?  ?Preterm ?  ?  ?AB ?2 ?  ?Living ?1 ? ?  ?SAB ?2 ?  ?IAB ?  ?  ?Ectopic ?  ?  ?Molar ?  ?  ?Multiple ?  ?  ?Live Births ?1 ? ?  ?  ?Obstetric Comments ?Placenta expressed with fragment retained, which was removed manually ? ? ? ?Allergies: is allergic to alpha-gal (galactose-alpha-1,3-galactose). ?Medications: ? ?Current Outpatient Medications:  ?  EPIPEN 2-PAK 0.3 mg/0.3 mL pen injector, AS DIRECTED AS NEEDED FOR SYSTEMIC REACTION INJECTION 30 DAYS, Disp: , Rfl: 1 ?  norethindrone (AYGESTIN) 5 mg tablet, Take 1 tablet (5 mg total) by mouth 2 (two) times daily Until bleeding stops, Disp: 60 tablet, Rfl: 0 ?  ergocalciferol, vitamin D2, 1,250 mcg (50,000 unit) capsule, Take one capsule once a week for 12 weeks (Patient not taking: Reported on 07/06/2021), Disp: 12 capsule, Rfl: 1 ?  etonogestrel-ethinyl estradiol (NUVARING) 0.12-0.015 mg/24 hr vaginal ring, Place 1 ring vaginally monthly Leave in place for 3 consecutive weeks, then remove for 1 week. (Patient not taking: Reported on 01/06/2021), Disp: 3 ring, Rfl: 4 ?  HYDROcodone-acetaminophen (NORCO) 5-325 mg tablet, Take 1 tablet by mouth every 4 (four) hours as needed for Pain (Patient not taking: Reported on 01/06/2021), Disp: 10 tablet, Rfl: 0 ?  lidocaine (LMX) 5 % cream, Apply topically as directed (Patient not taking: Reported on 01/06/2021), Disp: 15 g, Rfl: 0 ?  terconazole (TERAZOL 7) 0.4 % vaginal cream, Place 1 applicator vaginally at bedtime (Patient not taking: Reported on 07/27/2021), Disp: 45 g, Rfl: 3 ?  tretinoin (  RETIN-A) 0.1 % cream, Apply topically at bedtime (Patient not taking: Reported on 07/06/2021), Disp: 45 g, Rfl: 0 ? ?Review of Systems: ?No SOB, no palpitations or chest pain, no new lower extremity edema, no nausea or vomiting or bowel or bladder complaints. See HPI for gyn specific ROS. ? ? Exam: ? ?BP 115/72   Pulse 57    Ht 160 cm (5\' 3" )   Wt 73.5 kg (162 lb)   LMP 07/08/2021 (Approximate)   BMI 28.70 kg/m?  ? ?General: Patient is well-groomed, well-nourished, appears stated age in no acute distress ?  ?HEENT: head is atraumatic and normocephalic, trachea is midline, neck is supple with no palpable nodules ?  ?CV: Regular rhythm and normal heart rate, no murmur ?  ?Pulm: Clear to auscultation throughout lung fields with no wheezing, crackles, or rhonchi. No increased work of breathing ? ?Abdomen: soft , no mass, non-tender, no rebound tenderness, no hepatomegaly ? ?Pelvic: deferred ? ?Impression: ? ?The primary encounter diagnosis was Dyspareunia in female. Diagnoses of Primary dysmenorrhea, Request for sterilization, Episodic low back pain, and Urinary frequency were also pertinent to this visit. ? ?Plan: ? ?1. Chronic pelvic pain ?-Patient returns for a preoperative discussion regarding her plans to proceed with surgical treatment of her dysmenorrhea, frequency and bilateral complex ovarian cysts with undesired fertility by Dx laparoscopy with bilateral cystectomies and possible excision of endometriosis, possible LOA, cystoscopy with hydro distension and bilateral salpingectomy procedure.  The patient and I discussed the technical aspects of the procedure including the potential for risks and complications.  These include but are not limited to the risk of infection requiring post-operative antibiotics or further procedures.  We talked about the risk of injury to adjacent organs including bladder, bowel, ureter, blood vessels or nerves.  We talked about the need to convert to an open incision.  We talked about the possible need for blood transfusion.  We talked about postop complications such as thromboembolic or cardiopulmonary complications.  All of her questions were answered.  Her preoperative exam was completed and the appropriate consents were signed. She is scheduled to undergo this procedure in the near  future. ? ?Specific Peri-operative Considerations:  ?- Consent: obtained today ? ?- Labs: CBC, CMP preoperatively ?- Studies: EKG, CXR preoperatively ?- Bowel Preparation: None required ? ?- VTE ppx: SCDs perioperatively ? ? ? ?Return for Postop check. ?~~~~~~~~~~~~~~~~~~~~~~~~~~~~~~~~~~~~~~~~~~~~~~~~~~~~~~~~~~~~ ?This note is partially written by 07/10/2021, in the presence of and acting as the scribe of Dr. Jerene Canny, who has reviewed, edited and added to the note to reflect her best personal medical judgment. ? ?This note was generated in part with voice recognition software and I apologize for any typographical errors that were not detected and corrected.  ? ? ?Attestation Statement: ? ?I personally performed the service. (TP) ? ?Christeen Douglas, MD ? ? ? ? ? ?

## 2021-08-01 ENCOUNTER — Other Ambulatory Visit: Payer: Self-pay | Admitting: Obstetrics and Gynecology

## 2021-08-01 ENCOUNTER — Other Ambulatory Visit: Payer: Self-pay

## 2021-08-01 ENCOUNTER — Other Ambulatory Visit
Admission: RE | Admit: 2021-08-01 | Discharge: 2021-08-01 | Disposition: A | Discharge status: Home / Self Care | Payer: 59 | Source: Ambulatory Visit | Attending: Obstetrics and Gynecology | Admitting: Obstetrics and Gynecology

## 2021-08-01 HISTORY — DX: Unspecified asthma, uncomplicated: J45.909

## 2021-08-01 NOTE — Patient Instructions (Addendum)
Your procedure is scheduled on: 08/08/21 - Monday ?Report to the Registration Desk on the 1st floor of the Medical Mall. ?To find out your arrival time, please call [phone] between 1PM - 3PM on: 08/05/21 - Friday ? ?REMEMBER: ?Instructions that are not followed completely may result in serious medical risk, up to and including death; or upon the discretion of your surgeon and anesthesiologist your surgery may need to be rescheduled. ? ?Do not eat food after midnight the night before surgery.  ?No gum chewing, lozengers or hard candies. ? ?You may however, drink CLEAR liquids up to 2 hours before you are scheduled to arrive for your surgery. Do not drink anything within 2 hours of your scheduled arrival time. ? ?Clear liquids include: ?- water  ?- apple juice without pulp ?- gatorade (not RED colors) ?- black coffee or tea (Do NOT add milk or creamers to the coffee or tea) ?Do NOT drink anything that is not on this list. ? ?TAKE THESE MEDICATIONS THE MORNING OF SURGERY WITH A SIP OF WATER: NONE ? ?One week prior to surgery: ?Stop Anti-inflammatories (NSAIDS) such as Advil, Aleve, Ibuprofen, Motrin, Naproxen, Naprosyn and Aspirin based products such as Excedrin, Goodys Powder, BC Powder. ? ?Stop ANY OVER THE COUNTER supplements until after surgery. ? ?You may however, continue to take Tylenol if needed for pain up until the day of surgery. ? ?No Alcohol for 24 hours before or after surgery. ? ?No Smoking including e-cigarettes for 24 hours prior to surgery.  ?No chewable tobacco products for at least 6 hours prior to surgery.  ?No nicotine patches on the day of surgery. ? ?Do not use any "recreational" drugs for at least a week prior to your surgery.  ?Please be advised that the combination of cocaine and anesthesia may have negative outcomes, up to and including death. ?If you test positive for cocaine, your surgery will be cancelled. ? ?On the morning of surgery brush your teeth with toothpaste and water, you  may rinse your mouth with mouthwash if you wish. ?Do not swallow any toothpaste or mouthwash. ? ?Use CHG Soap or wipes as directed on instruction sheet. ? ?Do not wear jewelry, make-up, hairpins, clips or nail polish. ? ?Do not wear lotions, powders, or perfumes.  ? ?Do not shave body from the neck down 48 hours prior to surgery just in case you cut yourself which could leave a site for infection.  ?Also, freshly shaved skin may become irritated if using the CHG soap. ? ?Contact lenses, hearing aids and dentures may not be worn into surgery. ? ?Do not bring valuables to the hospital. [facility] is not responsible for any missing/lost belongings or valuables.  ? ?Notify your doctor if there is any change in your medical condition (cold, fever, infection). ? ?Wear comfortable clothing (specific to your surgery type) to the hospital. ? ?After surgery, you can help prevent lung complications by doing breathing exercises.  ?Take deep breaths and cough every 1-2 hours. Your doctor may order a device called an Incentive Spirometer to help you take deep breaths. ?When coughing or sneezing, hold a pillow firmly against your incision with both hands. This is called ?splinting.? Doing this helps protect your incision. It also decreases belly discomfort. ? ?If you are being admitted to the hospital overnight, leave your suitcase in the car. ?After surgery it may be brought to your room. ? ?If you are being discharged the day of surgery, you will not be allowed to drive  home. ?You will need a responsible adult (18 years or older) to drive you home and stay with you that night.  ? ?If you are taking public transportation, you will need to have a responsible adult (18 years or older) with you. ?Please confirm with your physician that it is acceptable to use public transportation.  ? ?Please call the Pre-admissions Testing Dept. at [phone] if you have any questions about these instructions. ? ?Surgery Visitation  Policy: ? ?Patients undergoing a surgery or procedure may have one family member or support person with them as long as that person is not COVID-19 positive or experiencing its symptoms.  ?That person may remain in the waiting area during the procedure and may rotate out with other people. ? ?Inpatient Visitation:   ? ?Visiting hours are 7 a.m. to 8 p.m. ?Up to two visitors ages 16+ are allowed at one time in a patient room. The visitors may rotate out with other people during the day. Visitors must check out when they leave, or other visitors will not be allowed. One designated support person may remain overnight. ?The visitor must pass COVID-19 screenings, use hand sanitizer when entering and exiting the patient?s room and wear a mask at all times, including in the patient?s room. ?Patients must also wear a mask when staff or their visitor are in the room. ?Masking is required regardless of vaccination status.  ?

## 2021-08-04 ENCOUNTER — Other Ambulatory Visit
Admission: RE | Admit: 2021-08-04 | Discharge: 2021-08-04 | Disposition: A | Discharge status: Home / Self Care | Payer: 59 | Source: Ambulatory Visit | Attending: Obstetrics and Gynecology | Admitting: Obstetrics and Gynecology

## 2021-08-04 ENCOUNTER — Other Ambulatory Visit: Payer: Self-pay

## 2021-08-04 DIAGNOSIS — N946 Dysmenorrhea, unspecified: Secondary | ICD-10-CM | POA: Diagnosis not present

## 2021-08-04 LAB — BASIC METABOLIC PANEL WITH GFR
Anion gap: 7 (ref 5–15)
BUN: 12 mg/dL (ref 6–20)
CO2: 26 mmol/L (ref 22–32)
Calcium: 8.7 mg/dL — ABNORMAL LOW (ref 8.9–10.3)
Chloride: 104 mmol/L (ref 98–111)
Creatinine, Ser: 0.96 mg/dL (ref 0.44–1.00)
GFR, Estimated: >60 mL/min (ref >60)
Glucose, Bld: 116 mg/dL — ABNORMAL HIGH (ref 70–99)
Potassium: 3.6 mmol/L (ref 3.5–5.1)
Sodium: 137 mmol/L (ref 135–145)

## 2021-08-04 LAB — TYPE AND SCREEN
ABO/RH(D): O POS
Antibody Screen: NEGATIVE

## 2021-08-04 LAB — CBC
HCT: 42 % (ref 36.0–46.0)
Hemoglobin: 13.8 g/dL (ref 12.0–15.0)
MCH: 28.6 pg (ref 26.0–34.0)
MCHC: 32.9 g/dL (ref 30.0–36.0)
MCV: 87 fL (ref 80.0–100.0)
Platelets: 195 10*3/uL (ref 150–400)
RBC: 4.83 MIL/uL (ref 3.87–5.11)
RDW: 12.3 % (ref 11.5–15.5)
WBC: 5.8 10*3/uL (ref 4.0–10.5)
nRBC: 0 % (ref 0.0–0.2)

## 2021-08-07 MED ORDER — LACTATED RINGERS IV SOLN: INTRAVENOUS | Status: DC

## 2021-08-07 MED ORDER — GABAPENTIN 300 MG PO CAPS: 300.0000 mg | ORAL_CAPSULE | ORAL | Status: AC

## 2021-08-07 MED ORDER — POVIDONE-IODINE 10 % EX SWAB: 2.0000 "application " | Freq: Once | CUTANEOUS | Status: DC

## 2021-08-07 MED ORDER — CHLORHEXIDINE GLUCONATE 0.12 % MT SOLN: 15.0000 mL | Freq: Once | OROMUCOSAL | Status: AC

## 2021-08-07 MED ORDER — ACETAMINOPHEN 500 MG PO TABS: 1000.0000 mg | ORAL_TABLET | ORAL | Status: AC

## 2021-08-07 MED ORDER — ORAL CARE MOUTH RINSE: 15.0000 mL | Freq: Once | OROMUCOSAL | Status: AC

## 2021-08-07 MED ORDER — FAMOTIDINE 20 MG PO TABS: 20.0000 mg | ORAL_TABLET | Freq: Once | ORAL | Status: AC

## 2021-08-08 ENCOUNTER — Encounter: Payer: Self-pay | Admitting: Obstetrics and Gynecology

## 2021-08-08 ENCOUNTER — Encounter
Admission: RE | Disposition: A | Discharge status: Home / Self Care | Payer: Self-pay | Source: Home / Self Care | Attending: Obstetrics and Gynecology

## 2021-08-08 ENCOUNTER — Other Ambulatory Visit: Payer: Self-pay

## 2021-08-08 ENCOUNTER — Ambulatory Visit: Payer: 59 | Admitting: Certified Registered"

## 2021-08-08 ENCOUNTER — Ambulatory Visit
Admission: RE | Admit: 2021-08-08 | Discharge: 2021-08-08 | Disposition: A | Discharge status: Home / Self Care | Payer: 59 | Attending: Obstetrics and Gynecology | Admitting: Obstetrics and Gynecology

## 2021-08-08 DIAGNOSIS — N736 Female pelvic peritoneal adhesions (postinfective): Secondary | ICD-10-CM | POA: Diagnosis not present

## 2021-08-08 DIAGNOSIS — R102 Pelvic and perineal pain: Secondary | ICD-10-CM | POA: Diagnosis not present

## 2021-08-08 DIAGNOSIS — N83291 Other ovarian cyst, right side: Secondary | ICD-10-CM | POA: Diagnosis not present

## 2021-08-08 DIAGNOSIS — N946 Dysmenorrhea, unspecified: Secondary | ICD-10-CM | POA: Insufficient documentation

## 2021-08-08 DIAGNOSIS — G8929 Other chronic pain: Secondary | ICD-10-CM | POA: Insufficient documentation

## 2021-08-08 DIAGNOSIS — N9489 Other specified conditions associated with female genital organs and menstrual cycle: Secondary | ICD-10-CM | POA: Insufficient documentation

## 2021-08-08 DIAGNOSIS — N80329 Endometriosis of the posterior cul-de-sac, unspecified depth: Secondary | ICD-10-CM | POA: Diagnosis not present

## 2021-08-08 DIAGNOSIS — Z302 Encounter for sterilization: Secondary | ICD-10-CM | POA: Insufficient documentation

## 2021-08-08 DIAGNOSIS — N83292 Other ovarian cyst, left side: Secondary | ICD-10-CM | POA: Insufficient documentation

## 2021-08-08 DIAGNOSIS — N8312 Corpus luteum cyst of left ovary: Secondary | ICD-10-CM | POA: Diagnosis not present

## 2021-08-08 DIAGNOSIS — N803C2 Endometriosis of the left uterosacral ligament, unspecified depth: Secondary | ICD-10-CM | POA: Diagnosis not present

## 2021-08-08 DIAGNOSIS — Z803 Family history of malignant neoplasm of breast: Secondary | ICD-10-CM | POA: Diagnosis not present

## 2021-08-08 DIAGNOSIS — R3989 Other symptoms and signs involving the genitourinary system: Secondary | ICD-10-CM | POA: Diagnosis not present

## 2021-08-08 DIAGNOSIS — N83202 Unspecified ovarian cyst, left side: Secondary | ICD-10-CM | POA: Diagnosis not present

## 2021-08-08 DIAGNOSIS — N83201 Unspecified ovarian cyst, right side: Secondary | ICD-10-CM | POA: Diagnosis not present

## 2021-08-08 HISTORY — PX: CYSTOSCOPY: SHX5120

## 2021-08-08 HISTORY — PX: LAPAROSCOPY: SHX197

## 2021-08-08 HISTORY — PX: LAPAROSCOPIC TUBAL LIGATION: SHX1937

## 2021-08-08 HISTORY — PX: LAPAROSCOPIC OVARIAN CYSTECTOMY: SHX6248

## 2021-08-08 HISTORY — PX: LYSIS OF ADHESION: SHX5961

## 2021-08-08 LAB — POCT PREGNANCY, URINE: Preg Test, Ur: NEGATIVE

## 2021-08-08 LAB — ABO/RH: ABO/RH(D): O POS

## 2021-08-08 SURGERY — LAPAROSCOPY, DIAGNOSTIC
Anesthesia: General | Site: Urethra

## 2021-08-08 MED ORDER — GABAPENTIN 300 MG PO CAPS
ORAL_CAPSULE | ORAL | Status: AC
  Administered 2021-08-08: 300 mg via ORAL
  Filled 2021-08-08: qty 1

## 2021-08-08 MED ORDER — ONDANSETRON HCL 4 MG/2ML IJ SOLN
INTRAMUSCULAR | Status: DC | PRN
  Administered 2021-08-08: 4 mg via INTRAVENOUS

## 2021-08-08 MED ORDER — FENTANYL CITRATE (PF) 250 MCG/5ML IJ SOLN
INTRAMUSCULAR | Status: AC
  Filled 2021-08-08: qty 5

## 2021-08-08 MED ORDER — DEXMEDETOMIDINE (PRECEDEX) IN NS 20 MCG/5ML (4 MCG/ML) IV SYRINGE
PREFILLED_SYRINGE | INTRAVENOUS | Status: DC | PRN
Start: 2021-08-08 — End: 2021-08-08
  Administered 2021-08-08: 8 ug via INTRAVENOUS

## 2021-08-08 MED ORDER — ACETAMINOPHEN 500 MG PO TABS: 1000.0000 mg | ORAL_TABLET | Freq: Four times a day (QID) | ORAL | 0 refills | Status: AC

## 2021-08-08 MED ORDER — DROPERIDOL 2.5 MG/ML IJ SOLN
0.6250 mg | Freq: Once | INTRAMUSCULAR | Status: DC | PRN
  Filled 2021-08-08: qty 2

## 2021-08-08 MED ORDER — ACETAMINOPHEN 10 MG/ML IV SOLN: 1000.0000 mg | Freq: Once | INTRAVENOUS | Status: DC | PRN

## 2021-08-08 MED ORDER — BUPIVACAINE HCL (PF) 0.5 % IJ SOLN
INTRAMUSCULAR | Status: AC
  Filled 2021-08-08: qty 30

## 2021-08-08 MED ORDER — DEXAMETHASONE SODIUM PHOSPHATE 10 MG/ML IJ SOLN
INTRAMUSCULAR | Status: DC | PRN
  Administered 2021-08-08: 10 mg via INTRAVENOUS

## 2021-08-08 MED ORDER — ONDANSETRON HCL 4 MG/2ML IJ SOLN
INTRAMUSCULAR | Status: AC
  Filled 2021-08-08: qty 2

## 2021-08-08 MED ORDER — SUGAMMADEX SODIUM 200 MG/2ML IV SOLN
INTRAVENOUS | Status: DC | PRN
  Administered 2021-08-08: 150 mg via INTRAVENOUS

## 2021-08-08 MED ORDER — PROPOFOL 10 MG/ML IV BOLUS
INTRAVENOUS | Status: AC
  Filled 2021-08-08: qty 20

## 2021-08-08 MED ORDER — ROCURONIUM BROMIDE 10 MG/ML (PF) SYRINGE
PREFILLED_SYRINGE | INTRAVENOUS | Status: AC
  Filled 2021-08-08: qty 10

## 2021-08-08 MED ORDER — DOCUSATE SODIUM 100 MG PO CAPS: 100.0000 mg | ORAL_CAPSULE | Freq: Two times a day (BID) | ORAL | 0 refills | Status: DC

## 2021-08-08 MED ORDER — GLYCOPYRROLATE 0.2 MG/ML IJ SOLN
INTRAMUSCULAR | Status: DC | PRN
  Administered 2021-08-08: .2 mg via INTRAVENOUS

## 2021-08-08 MED ORDER — LIDOCAINE HCL (PF) 2 % IJ SOLN
INTRAMUSCULAR | Status: AC
  Filled 2021-08-08: qty 5

## 2021-08-08 MED ORDER — DEXAMETHASONE SODIUM PHOSPHATE 10 MG/ML IJ SOLN
INTRAMUSCULAR | Status: AC
Start: 2021-08-08 — End: ?
  Filled 2021-08-08: qty 1

## 2021-08-08 MED ORDER — KETAMINE HCL 10 MG/ML IJ SOLN
INTRAMUSCULAR | Status: DC | PRN
  Administered 2021-08-08: 20 mg via INTRAVENOUS

## 2021-08-08 MED ORDER — KETAMINE HCL 50 MG/5ML IJ SOSY
PREFILLED_SYRINGE | INTRAMUSCULAR | Status: AC
  Filled 2021-08-08: qty 5

## 2021-08-08 MED ORDER — FAMOTIDINE 20 MG PO TABS
ORAL_TABLET | ORAL | Status: AC
  Administered 2021-08-08: 20 mg via ORAL
  Filled 2021-08-08: qty 1

## 2021-08-08 MED ORDER — ACETAMINOPHEN 500 MG PO TABS
ORAL_TABLET | ORAL | Status: AC
  Administered 2021-08-08: 1000 mg via ORAL
  Filled 2021-08-08: qty 2

## 2021-08-08 MED ORDER — OXYCODONE HCL 5 MG PO TABS: 5.0000 mg | ORAL_TABLET | Freq: Once | ORAL | Status: DC | PRN

## 2021-08-08 MED ORDER — KETOROLAC TROMETHAMINE 30 MG/ML IJ SOLN
INTRAMUSCULAR | Status: DC | PRN
  Administered 2021-08-08: 30 mg via INTRAVENOUS

## 2021-08-08 MED ORDER — MIDAZOLAM HCL 2 MG/2ML IJ SOLN
INTRAMUSCULAR | Status: AC
  Filled 2021-08-08: qty 2

## 2021-08-08 MED ORDER — ROCURONIUM BROMIDE 100 MG/10ML IV SOLN
INTRAVENOUS | Status: DC | PRN
  Administered 2021-08-08: 60 mg via INTRAVENOUS
  Administered 2021-08-08: 40 mg via INTRAVENOUS

## 2021-08-08 MED ORDER — GABAPENTIN 800 MG PO TABS: 800.0000 mg | ORAL_TABLET | Freq: Every day | ORAL | 0 refills | Status: DC

## 2021-08-08 MED ORDER — FENTANYL CITRATE (PF) 100 MCG/2ML IJ SOLN
INTRAMUSCULAR | Status: DC | PRN
  Administered 2021-08-08: 100 ug via INTRAVENOUS

## 2021-08-08 MED ORDER — IBUPROFEN 800 MG PO TABS: 800.0000 mg | ORAL_TABLET | Freq: Three times a day (TID) | ORAL | 1 refills | Status: AC

## 2021-08-08 MED ORDER — CHLORHEXIDINE GLUCONATE 0.12 % MT SOLN
OROMUCOSAL | Status: AC
  Administered 2021-08-08: 15 mL via OROMUCOSAL
  Filled 2021-08-08: qty 15

## 2021-08-08 MED ORDER — FENTANYL CITRATE (PF) 100 MCG/2ML IJ SOLN
25.0000 ug | INTRAMUSCULAR | Status: DC | PRN
  Administered 2021-08-08: 25 ug via INTRAVENOUS

## 2021-08-08 MED ORDER — LACTATED RINGERS IV SOLN: INTRAVENOUS | Status: DC

## 2021-08-08 MED ORDER — FENTANYL CITRATE (PF) 100 MCG/2ML IJ SOLN
INTRAMUSCULAR | Status: AC
  Administered 2021-08-08: 25 ug via INTRAVENOUS
  Filled 2021-08-08: qty 2

## 2021-08-08 MED ORDER — OXYCODONE HCL 5 MG PO TABS: 5.0000 mg | ORAL_TABLET | ORAL | 0 refills | Status: DC | PRN

## 2021-08-08 MED ORDER — PROPOFOL 10 MG/ML IV BOLUS
INTRAVENOUS | Status: DC | PRN
  Administered 2021-08-08: 10 mg via INTRAVENOUS

## 2021-08-08 MED ORDER — PROPOFOL 500 MG/50ML IV EMUL
INTRAVENOUS | Status: DC | PRN
  Administered 2021-08-08: 150 ug/kg/min via INTRAVENOUS

## 2021-08-08 MED ORDER — PROPOFOL 500 MG/50ML IV EMUL
INTRAVENOUS | Status: AC
  Filled 2021-08-08: qty 50

## 2021-08-08 MED ORDER — MIDAZOLAM HCL 2 MG/2ML IJ SOLN
INTRAMUSCULAR | Status: DC | PRN
Start: 2021-08-08 — End: 2021-08-08
  Administered 2021-08-08: 2 mg via INTRAVENOUS

## 2021-08-08 MED ORDER — BUPIVACAINE HCL 0.5 % IJ SOLN
INTRAMUSCULAR | Status: DC | PRN
  Administered 2021-08-08: 13 mL

## 2021-08-08 MED ORDER — OXYCODONE HCL 5 MG/5ML PO SOLN: 5.0000 mg | Freq: Once | ORAL | Status: DC | PRN

## 2021-08-08 MED ORDER — LIDOCAINE HCL (CARDIAC) PF 100 MG/5ML IV SOSY
PREFILLED_SYRINGE | INTRAVENOUS | Status: DC | PRN
  Administered 2021-08-08: 80 mg via INTRAVENOUS

## 2021-08-08 SURGICAL SUPPLY — 76 items
ADH SKN CLS APL DERMABOND .7 (GAUZE/BANDAGES/DRESSINGS) ×3
APL PRP STRL LF DISP 70% ISPRP (MISCELLANEOUS) ×3
APL SRG 38 LTWT LNG FL B (MISCELLANEOUS) ×3
APPLICATOR ARISTA FLEXITIP XL (MISCELLANEOUS) ×5 IMPLANT
BACTOSHIELD CHG 4% 4OZ (MISCELLANEOUS) ×1
BAG DRN RND TRDRP ANRFLXCHMBR (UROLOGICAL SUPPLIES) ×3
BAG RETRIEVAL 10 (BASKET)
BAG URINE DRAIN 2000ML AR STRL (UROLOGICAL SUPPLIES) ×5 IMPLANT
BLADE SURG SZ11 CARB STEEL (BLADE) ×5 IMPLANT
CATH FOLEY 2WAY  5CC 16FR (CATHETERS) ×4
CATH FOLEY 2WAY 5CC 16FR (CATHETERS) ×3
CATH ROBINSON RED A/P 16FR (CATHETERS) ×5 IMPLANT
CATH URTH 16FR FL 2W BLN LF (CATHETERS) ×4 IMPLANT
CHLORAPREP W/TINT 26 (MISCELLANEOUS) ×5 IMPLANT
CORD MONOPOLAR M/FML 12FT (MISCELLANEOUS) ×5 IMPLANT
DERMABOND ADVANCED (GAUZE/BANDAGES/DRESSINGS) ×1
DERMABOND ADVANCED .7 DNX12 (GAUZE/BANDAGES/DRESSINGS) ×4 IMPLANT
DRAPE GENERAL ENDO 106X123.5 (DRAPES) ×5 IMPLANT
DRAPE LEGGINS SURG 28X43 STRL (DRAPES) ×5 IMPLANT
DRAPE STERI POUCH LG 24X46 STR (DRAPES) IMPLANT
DRAPE UNDER BUTTOCK W/FLU (DRAPES) ×5 IMPLANT
DRAPE UTILITY 15X26 TOWEL STRL (DRAPES) ×10 IMPLANT
GAUZE 4X4 16PLY ~~LOC~~+RFID DBL (SPONGE) ×10 IMPLANT
GLOVE SURG ENC MOIS LTX SZ7 (GLOVE) ×10 IMPLANT
GLOVE SURG SYN 8.0 (GLOVE) ×4 IMPLANT
GLOVE SURG SYN 8.0 PF PI (GLOVE) ×4 IMPLANT
GLOVE SURG UNDER LTX SZ7.5 (GLOVE) ×5 IMPLANT
GOWN STRL REUS W/ TWL LRG LVL3 (GOWN DISPOSABLE) ×8 IMPLANT
GOWN STRL REUS W/ TWL XL LVL3 (GOWN DISPOSABLE) ×4 IMPLANT
GOWN STRL REUS W/TWL LRG LVL3 (GOWN DISPOSABLE) ×8
GOWN STRL REUS W/TWL XL LVL3 (GOWN DISPOSABLE) ×4
GRASPER SUT TROCAR 14GX15 (MISCELLANEOUS) ×5 IMPLANT
HEMOSTAT ARISTA ABSORB 3G PWDR (HEMOSTASIS) IMPLANT
IRRIGATION STRYKERFLOW (MISCELLANEOUS) IMPLANT
IRRIGATOR STRYKERFLOW (MISCELLANEOUS)
IV LACTATED RINGERS 1000ML (IV SOLUTION) ×5 IMPLANT
IV NS 1000ML (IV SOLUTION) ×4
IV NS 1000ML BAXH (IV SOLUTION) ×4 IMPLANT
KIT PINK PAD W/HEAD ARE REST (MISCELLANEOUS) ×4
KIT PINK PAD W/HEAD ARM REST (MISCELLANEOUS) ×4 IMPLANT
KIT TURNOVER CYSTO (KITS) ×5 IMPLANT
L-HOOK LAP DISP 36CM (ELECTROSURGICAL)
LABEL OR SOLS (LABEL) ×5 IMPLANT
LHOOK LAP DISP 36CM (ELECTROSURGICAL) IMPLANT
LIGASURE VESSEL 5MM BLUNT TIP (ELECTROSURGICAL) IMPLANT
MANIFOLD NEPTUNE II (INSTRUMENTS) ×5 IMPLANT
NS IRRIG 500ML POUR BTL (IV SOLUTION) ×5 IMPLANT
PACK GYN LAPAROSCOPIC (MISCELLANEOUS) ×5 IMPLANT
PAD OB MATERNITY 4.3X12.25 (PERSONAL CARE ITEMS) ×5 IMPLANT
PAD PREP 24X41 OB/GYN DISP (PERSONAL CARE ITEMS) ×5 IMPLANT
PENCIL ELECTRO HAND CTR (MISCELLANEOUS) IMPLANT
SCISSORS METZENBAUM CVD 33 (INSTRUMENTS) IMPLANT
SCRUB CHG 4% DYNA-HEX 4OZ (MISCELLANEOUS) ×4 IMPLANT
SET CYSTO W/LG BORE CLAMP LF (SET/KITS/TRAYS/PACK) ×5 IMPLANT
SET TUBE SMOKE EVAC HIGH FLOW (TUBING) ×5 IMPLANT
SHEARS HARMONIC ACE PLUS 36CM (ENDOMECHANICALS) ×1 IMPLANT
SLEEVE ENDOPATH XCEL 5M (ENDOMECHANICALS) ×10 IMPLANT
SOL PREP PVP 2OZ (MISCELLANEOUS) ×4
SOLUTION PREP PVP 2OZ (MISCELLANEOUS) ×4 IMPLANT
STRIP CLOSURE SKIN 1/4X4 (GAUZE/BANDAGES/DRESSINGS) ×5 IMPLANT
SURGILUBE 2OZ TUBE FLIPTOP (MISCELLANEOUS) ×5 IMPLANT
SUT MNCRL 4-0 (SUTURE) ×4
SUT MNCRL 4-0 27XMFL (SUTURE) ×3
SUT MNCRL AB 4-0 PS2 18 (SUTURE) ×5 IMPLANT
SUT VIC AB 0 UR5 27 (SUTURE) ×5 IMPLANT
SUT VIC AB 2-0 UR6 27 (SUTURE) ×5 IMPLANT
SUT VIC AB 4-0 SH 27 (SUTURE) ×4
SUT VIC AB 4-0 SH 27XANBCTRL (SUTURE) ×4 IMPLANT
SUTURE MNCRL 4-0 27XMF (SUTURE) ×4 IMPLANT
SYR 50ML LL SCALE MARK (SYRINGE) IMPLANT
SYR 5ML LL (SYRINGE) IMPLANT
SYS BAG RETRIEVAL 10MM (BASKET)
SYSTEM BAG RETRIEVAL 10MM (BASKET) IMPLANT
TROCAR XCEL NON-BLD 5MMX100MML (ENDOMECHANICALS) ×5 IMPLANT
TUBING ART PRESS 48 MALE/FEM (TUBING) IMPLANT
WATER STERILE IRR 500ML POUR (IV SOLUTION) ×5 IMPLANT

## 2021-08-08 NOTE — Transfer of Care (Signed)
Immediate Anesthesia Transfer of Care Note ? ?Patient: Joyce Lee ? ?Procedure(s) Performed: LAPAROSCOPY DIAGNOSTIC EXCISION OF ENDOMETRIOSIS (Abdomen) ?LAPAROSCOPIC OVARIAN CYSTECTOMY (Bilateral: Abdomen) ?CYSTOSCOPY WITH HYDRODISTENTION (Urethra) ?LYSIS OF ADHESION (Abdomen) ?LAPAROSCOPIC TUBAL LIGATION (Bilateral: Abdomen) ? ?Patient Location: PACU ? ?Anesthesia Type:General ? ?Level of Consciousness: drowsy ? ?Airway & Oxygen Therapy: Patient Spontanous Breathing and Patient connected to face mask oxygen ? ?Post-op Assessment: Report given to RN and Post -op Vital signs reviewed and stable ? ?Post vital signs: Reviewed and stable ? ?Last Vitals:  ?Vitals Value Taken Time  ?BP 100/57 08/08/21 1330  ?Temp    ?Pulse 55 08/08/21 1331  ?Resp 17 08/08/21 1331  ?SpO2 100 % 08/08/21 1331  ?Vitals shown include unvalidated device data. ? ?Last Pain:  ?Vitals:  ? 08/08/21 1050  ?TempSrc: Temporal  ?PainSc: 1   ?   ? ?  ? ?Complications: No notable events documented. ?

## 2021-08-08 NOTE — Op Note (Addendum)
Joyce Lee ?PROCEDURE DATE: 08/08/2021 ? ?PREOPERATIVE DIAGNOSIS:  ?- chronic pelvic pain ?- undesired fertility ?- bilateral ovarian cysts ?- back pain ? ?POSTOPERATIVE DIAGNOSIS: Endometriosis, simple ovarian cysts bilaterally, pelvic adhesive disease, undesired fertility, possible IC ? ?PROCEDURE:  ?LAPAROSCOPY DIAGNOSTIC EXCISION OF ENDOMETRIOSIS: 49320 (CPT?) ?LAPAROSCOPIC OVARIAN CYSTECTOMY: 37482 (CPT?) ?CYSTOSCOPY WITH HYDRODISTENTION: 52000 (CPT?) ?LYSIS OF ADHESION: [id number] ?LAPAROSCOPIC TUBAL LIGATION:  ? ?SURGEON:  Dr. Christeen Douglas, MD MPH ?ASSISTANT: Dr. Jennell Corner, MD  ?ANESTHESIOLOGIST: Foye Deer, MD Anesthesiologist: Foye Deer, MD ?CRNA: Cheral Bay, CRNA ?Student Nurse Anesthetist: Kathryne Hitch, RN ? ?INDICATIONS: 41 y.o. F with history of chronic pelvic pain desiring surgical evaluation, and undesired fertility and ovarian cysts.   Please see preoperative notes for further details.  Risks of surgery were discussed with the patient including but not limited to: bleeding which may require transfusion or reoperation; infection which may require antibiotics; injury to bowel, bladder, ureters or other surrounding organs; need for additional procedures including laparotomy; thromboembolic phenomenon, incisional problems and other postoperative/anesthesia complications. Written informed consent was obtained.   ? ?FINDINGS:  Small uterus with clear blebs anteriorly and right laterally which were well-adhesed and unable to be removed, normal ovaries and fallopian tubes bilaterally. Left ovarian cyst that appeared to be corpus luteum. ? ?Endometriosis scarring on left USL and in right posterior cul de sac.  No scarring in the ovarian fossae. Peritoneal biopsies were taken and sent to pathology. No other abdominal/pelvic abnormality.  Normal upper abdomen. Normal appendix. No other abnormality in the RLQ.  ? ?Left colon adhesions to the side,  removed. ? ?Possible Hunner's ulcers after hydrodistention of the bladder ? ?ANESTHESIA:    General ?INTRAVENOUS FLUIDS: 1000 ml ?ESTIMATED BLOOD LOSS: 0 ?URINE OUTPUT: 50 ml ?SPECIMENS: Endo excision in posterior cul de sac, left uterosacral ligament and right posterior cul de sac.  ?COMPLICATIONS: None immediate ? ?PROCEDURE IN DETAIL:  The patient had sequential compression devices applied to her lower extremities while in the preoperative area.  She was then taken to the operating room where general anesthesia was administered and was found to be adequate.  She was placed in the dorsal lithotomy position, and was prepped and draped in a sterile manner.  A Foley catheter was inserted into her bladder and attached to constant drainage and a uterine manipulator was then advanced into the uterus .  After an adequate timeout was performed, attention was turned to the abdomen where an umbilical incision was made with the scalpel.  The Optiview 5-mm trocar and sleeve were then advanced without difficulty with the laparoscope under direct visualization into the abdomen.  The abdomen was then insufflated with carbon dioxide gas and adequate pneumoperitoneum was obtained.   A detailed survey of the patient's pelvis and abdomen revealed the findings as mentioned above.   ? ?The fallopian tubes were observed and found to be normal in appearance. A Harmonic device was then advanced through the operative port and used to coagulate and excise the distal portion of the Fallopian tube, including the fimbriated ends.  Good blanching and coagulation was noted at the site of the application.  There was no bleeding noted in the mesosalpinx.  A similar process was carried out on the right fallopian tube allowing for bilateral tubal sterilization.   Good hemostasis was noted overall.  ? ?Attention was turned to the area of endometriosis and scarring. The peritoneum was sharply excised and sent to pathology. The left ovarian cyst was  excised and  appeared to be a corpus luteum. The clear blebs on the anterior uterus were attempted to be removed sharply, but bleeding prevented full removal. ? ?The operative site was surveyed, and it was found to be hemostatic.  Pressure was dropped assuring hemostasis. No intraoperative injury to surrounding organs was noted. ? ?Pictures were taken of the quadrants and pelvis. The abdomen was desufflated and all instruments were then removed from the patient's abdomen. The uterine manipulator was removed without complications.  All incisions were closed with 4-0 Vicryl and Dermabond.  ? ?Operative CYSTOCOPY PARAGRAPH ?The Foley catheter was temporarily removed and an uncomplicated cystoscopy was performed. 800 mL of fluid was instilled for hydro-distention, drained, and the bladder mucosa was visualized again with the above findings. Excellent efflux was noted from both ureteral orifices. Possible Hunner's ulcers with redness, no white areas, noted after hydrodistention. ? ?The patient tolerated the procedures well.  All instruments, needles, and sponge counts were correct x 2. The patient was taken to the recovery room in stable condition.  ? ? ? ?  ?

## 2021-08-08 NOTE — Interval H&P Note (Signed)
History and Physical Interval Note: ? ?08/08/2021 ?11:06 AM ? ?Joyce Lee  has presented today for surgery, with the diagnosis of pelvic pain, bilateral ovarian cysts - complex, bladder pain,STERILIZATION.  The various methods of treatment have been discussed with the patient and family. After consideration of risks, benefits and other options for treatment, the patient has consented to  Procedure(s): ?LAPAROSCOPY DIAGNOSTIC EXCISION OF ENDOMETRIOSIS (N/A) ?LAPAROSCOPIC OVARIAN CYSTECTOMY (Bilateral) ?CYSTOSCOPY WITH HYDRODISTENTION (N/A) ?LYSIS OF ADHESION (N/A) ?LAPAROSCOPIC TUBAL LIGATION (Bilateral) as a surgical intervention.  The patient's history has been reviewed, patient examined, no change in status, stable for surgery.  I have reviewed the patient's chart and labs.  Questions were answered to the patient's satisfaction.   ? ? ?Christeen Douglas ? ? ?

## 2021-08-08 NOTE — Anesthesia Procedure Notes (Addendum)
Procedure Name: Intubation ?Date/Time: 08/08/2021 11:47 AM ?Performed by: Biagio Borg, CRNA ?Pre-anesthesia Checklist: Patient identified, Emergency Drugs available, Suction available and Patient being monitored ?Patient Re-evaluated:Patient Re-evaluated prior to induction ?Oxygen Delivery Method: Circle system utilized ?Preoxygenation: Pre-oxygenation with 100% oxygen ?Induction Type: IV induction ?Ventilation: Mask ventilation without difficulty ?Laryngoscope Size: Mac and 3 ?Grade View: Grade I ?Tube type: Oral ?Tube size: 7.0 mm ?Number of attempts: 1 ?Airway Equipment and Method: Stylet and Oral airway ?Placement Confirmation: ETT inserted through vocal cords under direct vision, positive ETCO2 and breath sounds checked- equal and bilateral ?Secured at: 22 cm ?Tube secured with: Tape ?Dental Injury: Teeth and Oropharynx as per pre-operative assessment  ? ? ? ? ?

## 2021-08-08 NOTE — Anesthesia Preprocedure Evaluation (Addendum)
Anesthesia Evaluation  ?Patient identified by MRN, date of birth, ID band ?Patient awake ? ? ? ?Reviewed: ?Allergy & Precautions, NPO status , Patient's Chart, lab work & pertinent test results ? ?Airway ?Mallampati: II ? ?TM Distance: >3 FB ?Neck ROM: full ? ? ? Dental ?no notable dental hx. ? ?  ?Pulmonary ?asthma ,  ?  ?Pulmonary exam normal ? ? ? ? ? ? ? Cardiovascular ?negative cardio ROS ?Normal cardiovascular exam ? ? ?  ?Neuro/Psych ?negative neurological ROS ? negative psych ROS  ? GI/Hepatic ?negative GI ROS, Neg liver ROS,   ?Endo/Other  ?negative endocrine ROS ? Renal/GU ?  ? ?  ?Musculoskeletal ? ? Abdominal ?Normal abdominal exam  (+)   ?Peds ? Hematology ?negative hematology ROS ?(+)   ?Anesthesia Other Findings ?Pelvic pain ? ?Past Medical History: ?No date: Asthma ?    Comment:  cold weather ?No date: Factor V Leiden ?No date: Family history of breast cancer ?No date: Family history of melanoma ?No date: Vitamin D deficiency ? ?Past Surgical History: ?No date: DILATION AND CURETTAGE OF UTERUS ?No date: LAPAROSCOPIC ABDOMINAL EXPLORATION ?No date: WISDOM TOOTH EXTRACTION ? ?BMI   ? Body Mass Index: 28.90 kg/m?  ?  ? ? Reproductive/Obstetrics ?negative OB ROS ? ?  ? ? ? ? ? ? ? ? ? ? ? ? ? ?  ?  ? ? ? ? ? ? ?Anesthesia Physical ?Anesthesia Plan ? ?ASA: 2 ? ?Anesthesia Plan: General ETT  ? ?Post-op Pain Management: Tylenol PO (pre-op)* and Gabapentin PO (pre-op)*  ? ?Induction: Intravenous ? ?PONV Risk Score and Plan: Ondansetron, Dexamethasone, Midazolam and Treatment may vary due to age or medical condition ? ?Airway Management Planned: Oral ETT ? ?Additional Equipment:  ? ?Intra-op Plan:  ? ?Post-operative Plan: Extubation in OR ? ?Informed Consent: I have reviewed the patients History and Physical, chart, labs and discussed the procedure including the risks, benefits and alternatives for the proposed anesthesia with the patient or authorized representative who has  indicated his/her understanding and acceptance.  ? ? ? ?Dental advisory given ? ?Plan Discussed with: Anesthesiologist, CRNA and Surgeon ? ?Anesthesia Plan Comments:   ? ? ? ? ? ?Anesthesia Quick Evaluation ? ?

## 2021-08-08 NOTE — Discharge Instructions (Addendum)
Laparoscopic Ovarian Surgery Discharge Instructions ? ?For the next three days, take ibuprofen and acetaminophen on a schedule, every 8 hours. You can take them together or you can intersperse them, and take one every four hours. I also gave you gabapentin for nighttime, to help you sleep and also to control pain. Take gabapentin medicines at night for at least the next 3 nights. You also have a narcotic, oxycodone, to take as needed if the above medicines don't help. ? ?Postop constipation is a major cause of pain. Stay well hydrated, walk as you tolerate, and take over the counter senna as well as stool softeners if you need them. ? ? ?RISKS AND COMPLICATIONS  ?Infection. ?Bleeding. ?Injury to surrounding organs. ?Anesthetic side effects. ? ? ?PROCEDURE  ?You may be given a medicine to help you relax (sedative) before the procedure. You will be given a medicine to make you sleep (general anesthetic) during the procedure. ?A tube will be put down your throat to help your breath while under general anesthesia. ?Several small cuts (incisions) are made in the lower abdominal area and one incision is made near the belly button. ?Your abdominal area will be inflated with a safe gas (carbon dioxide). This helps give the surgeon room to operate, visualize, and helps the surgeon avoid other organs. ?A thin, lighted tube (laparoscope) with a camera attached is inserted into your abdomen through the incision near the belly button. Other small instruments may also be inserted through other abdominal incisions. ?The ovary is located and are removed. ?After the ovary is removed, the gas is released from the abdomen. ?The incisions will be closed with stitches (sutures), and Dermabond. A bandage may be placed over the incisions. ? ?AFTER THE PROCEDURE  ?You will also have some mild abdominal discomfort for 3-7 days. You will be given pain medicine to ease any discomfort. ?As long as there are no problems, you may be allowed to  go home. Someone will need to drive you home and be with you for at least 24 hours once home. ?You may have some mild discomfort in the throat. This is from the tube placed in your throat while you were sleeping. ?You may experience discomfort in the shoulder area from some trapped air between the liver and diaphragm. This sensation is normal and will slowly go away on its own. ? ?HOME CARE INSTRUCTIONS  ?Take all medicines as directed. ?Only take over-the-counter or prescription medicines for pain, discomfort, or fever as directed by your caregiver. ?Resume daily activities as directed. ?Showers are preferred over baths for 2 weeks. ?You may resume sexual activities in 1 week or as you feel you would like to. ?Do not drive while taking narcotics. ? ?SEEK MEDICAL CARE IF: . ?There is increasing abdominal pain. ?You feel lightheaded or faint. ?You have the chills. ?You have an oral temperature above 102? F (38.9? C). ?There is pus-like (purulent) drainage from any of the wounds. ?You are unable to pass gas or have a bowel movement. ?You feel sick to your stomach (nauseous) or throw up (vomit) and can't control it with your medicines. ? ?MAKE SURE YOU:  ?Understand these instructions. ?Will watch your condition. ?Will get help right away if you are not doing well or get worse. ? ?ExitCare? Patient Information ?[address], [place]. ? ? ? ? Here is a helpful article from the website http://mitchell.org/, regarding constipation ? ?Here are reasons why constipation occurs after surgery: ?1) During the operation and in the recovery room, most  people are given opioid pain medication, primarily through an IV, to treat moderate or severe pain. Intravenous opioids include morphine, Dilaudid and fentanyl. After surgery, patients are often prescribed opioid pain medication to take by mouth at home, including codeine, Vicodin?, Norco?, and Percocet?. All of these medications cause constipation by slowing down the movement of your  intestine. ?2) Changes in your diet before surgery can be another culprit. It is common to get specific instructions to change how you normally eat or drink before your surgery, like only having liquids the day before or not having anything to eat or drink after midnight the night before surgery. For this reason, temporary dehydration may occur. This, along with not eating or only having liquids, means that you are getting less fiber than usual. Both these factors contribute to constipation. ?3) Changes in your diet after surgery can also contribute to the problem. Although many people don?t have dietary restrictions after operations, being under anesthesia can make you lose your appetite for several hours and maybe even days. Some people can even have nausea or vomiting. Not eating or drinking normally means that you are not getting enough fiber and you can get dehydrated, both leading to constipation. ?4) Lying in a bed more than usual--which happens before, during and after surgery--combined with the medications and diet changes, all work together to slow down your colon and make your poop turn to rock. ? ?No one likes to be constipated.  ?Let?s face it, it?s not a pleasant feeling when you don?t poop for days, then strain on the toilet to finally pass something large enough to cause damage. An ounce of prevention is worth a pound of cure, so: ?Assume you will be constipated. ?Plan and prepare accordingly. ?Post-surgery is one of those unique situations where the temporary use of laxatives can make a world of difference. Always consult with your doctor, and recognize that if you wait several days after surgery to take a laxative, the constipation might be too severe for these over-the-counter options. It is always important to discuss all medications you plan on taking with your doctor. Ask your doctor if you can start the laxative immediately after surgery. * ? ?Here are go-to post-surgery laxatives: ?Senna:  Senna is an herb that acts as a ?stimulant laxative,? meaning it increases the activity of the intestine to cause you to have a bowel movement. It comes in many forms, but senna pills are easy to take and are sold over the counter at almost all pharmacies. Since opioid pain medications slow down the activity of the intestine, it makes sense to take a medication to help reverse that side effect. Long-term use of a stimulant laxative is not a good idea since it can make your colon ?lazy? and not function properly; however, temporary use immediately after surgery is acceptable. In general, if you are able to eat a normal diet, taking senna soon after surgery works the best. Senna usually works within hours to produce a bowel movement, but this is less predictable when you are taking different medications after surgery. Try not to wait several days to start taking senna, as often it is too late by then. Just like with all medications or supplements, check with your doctor before starting new treatment.  ? ?Magnesium: Magnesium is an important mineral that our body needs. We get magnesium from some foods that we eat, especially foods that are high in fiber such as broccoli, almonds and whole grains. There are also magnesium-based medications  used to treat constipation including milk of magnesia (magnesium hydroxide), magnesium citrate and magnesium oxide. They work by drawing water into the intestine, putting it into the class of ?osmotic? laxatives. Magnesium products in low doses appear to be safe, but if taken in very large doses, can lead to problems such as irregular heartbeat, low blood pressure and even death. It can also affect other medications you might be taking, therefore it is important to discuss using magnesium with your physician and pharmacist before initiating therapy. Most over-the-counter magnesium laxatives work very well to help with the constipation related to surgery, but sometimes they work too  well and lead to diarrhea. Make sure you are somewhere with easy access to a bathroom, just in case.  ? ?Bisacodyl: Bisacodyl (generic name) is sold under brand names such as Dulcolax?. Much like senna, it is a ?st

## 2021-08-09 ENCOUNTER — Telehealth: Payer: Self-pay | Admitting: Licensed Clinical Social Worker

## 2021-08-09 ENCOUNTER — Encounter: Payer: Self-pay | Admitting: Licensed Clinical Social Worker

## 2021-08-09 ENCOUNTER — Ambulatory Visit: Payer: Self-pay | Admitting: Licensed Clinical Social Worker

## 2021-08-09 ENCOUNTER — Encounter: Payer: Self-pay | Admitting: Obstetrics and Gynecology

## 2021-08-09 DIAGNOSIS — Z1379 Encounter for other screening for genetic and chromosomal anomalies: Secondary | ICD-10-CM | POA: Insufficient documentation

## 2021-08-09 LAB — SURGICAL PATHOLOGY

## 2021-08-09 NOTE — Progress Notes (Signed)
HPI:  Ms. Mcmeekin was previously seen in the [facility] due to a family history of cancer and concerns regarding a hereditary predisposition to cancer. Please refer to our prior cancer genetics clinic note for more information regarding our discussion, assessment and recommendations, at the time. Ms. Shimon recent genetic test results were disclosed to her, as were recommendations warranted by these results. These results and recommendations are discussed in more detail below. ? ?CANCER HISTORY:  ?Oncology History  ? No history exists.  ? ? ?FAMILY HISTORY:  ?We obtained a detailed, 4-generation family history.  Significant diagnoses are listed below: ?Family History  ?Problem Relation Age of Onset  ? Factor V Leiden deficiency Mother   ? Melanoma Father   ?     x3 or more  ? Skin cancer Father   ?     many SCC + BCC  ? Breast cancer Maternal Aunt   ?     dx 40s-50s  ? Breast cancer Paternal Aunt   ?     dx 60s  ? Skin cancer Paternal Grandfather   ?Ms. Ybanez has 1 daughter, 7. She has 1 sister, 65, no history of cancer. ?  ?Ms. Arington's mother is living at 30 and has not had cancer. Patient has 2 maternal aunts, 1 uncle. One aunt had breast cancer in her 70s-50s. No known cousins with cancer. Maternal grandmother is living at 41, grandfather died in his late 38s in an accident. ?  ?Ms. Paola's father is 12 and has had more than 3 melanomas removed and many basal cell and squamous cell carcinomas of his skin removed. Patient has 1 paternal aunt and she had breast cancer in her 88s. Patient does not have paternal first cousins. Paternal grandmother died at 19 of a heart attack, grandfather died of skin cancer in his 53s. ?  ?Ms. Name is unaware of previous family history of genetic testing for hereditary cancer risks. Patient's maternal ancestors are of Scottish/[place] descent, and paternal ancestors are of Scottish/[place] descent. There is no reported Ashkenazi Jewish  ancestry. There is no known consanguinity. ?  ?  ? ? ?GENETIC TEST RESULTS: Genetic testing reported out on 08/06/2021 through the [facility] Multi-Cancer+RNA panel (STAT testing reported out 3/9) cancer panel found no pathogenic mutations.  ? ?The Multi-Cancer Panel + RNA offered by [facility] includes sequencing and/or deletion duplication testing of the following 84 genes: AIP, ALK, APC, ATM, AXIN2,BAP1,  BARD1, BLM, BMPR1A, BRCA1, BRCA2, BRIP1, CASR, CDC73, CDH1, CDK4, CDKN1B, CDKN1C, CDKN2A (p14ARF), CDKN2A (p16INK4a), CEBPA, CHEK2, CTNNA1, DICER1, DIS3L2, EGFR (c.2369C>T, p.Thr790Met variant only), EPCAM (Deletion/duplication testing only), FH, FLCN, GATA2, GPC3, GREM1 (Promoter region deletion/duplication testing only), HOXB13 (c.251G>A, p.Gly84Glu), HRAS, KIT, MAX, MEN1, MET, MITF (c.952G>A, p.Glu318Lys variant only), MLH1, MSH2, MSH3, MSH6, MUTYH, NBN, NF1, NF2, NTHL1, PALB2, PDGFRA, PHOX2B, PMS2, POLD1, POLE, POT1, PRKAR1A, PTCH1, PTEN, RAD50, RAD51C, RAD51D, RB1, RECQL4, RET, RUNX1, SDHAF2, SDHA (sequence changes only), SDHB, SDHC, SDHD, SMAD4, SMARCA4, SMARCB1, SMARCE1, STK11, SUFU, TERC, TERT, TMEM127, TP53, TSC1, TSC2, VHL, WRN and WT1.  ? ?The test report has been scanned into EPIC and is located under the Molecular Pathology section of the Results Review tab.  A portion of the result report is included below for reference.  ? ? ? ? ?ADDITIONAL GENETIC TESTING: We discussed with Ms. Hemmer that her genetic testing was fairly extensive.  If there are genes identified to increase cancer risk that can be analyzed in the future, we would  be happy to discuss and coordinate this testing at that time.   ? ?CANCER SCREENING RECOMMENDATIONS: Ms. Sick test result is considered negative (normal).  This means that we have not identified a hereditary cause for her family history of cancer at this time.  ? ?While reassuring, this does not definitively rule out a hereditary predisposition to cancer. It is still possible  that there could be genetic mutations that are undetectable by current technology. There could be genetic mutations in genes that have not been tested or identified to increase cancer risk.  Therefore, it is recommended she continue to follow the cancer management and screening guidelines provided by her primary healthcare provider.  ? ?An individual's cancer risk and medical management are not determined by genetic test results alone. Overall cancer risk assessment incorporates additional factors, including personal medical history, family history, and any available genetic information that may result in a personalized plan for cancer prevention and surveillance. ? ?Based on Ms. Waterworth's personal and family history of cancer as well as her genetic test results, risk model Harriett Rush was used to estimate her risk of developing breast cancer. This estimates her lifetime risk of developing breast cancer to be approximately 19%.  The patient's lifetime breast cancer risk is a preliminary estimate based on available information using one of several models endorsed by the [employer]  (NCCN). The NCCN recommends consideration of breast MRI screening as an adjunct to mammography for patients at high risk (defined as 20% or greater lifetime risk).  This risk estimate can change over time and may be repeated to reflect new information in her personal or family history in the future. ? ? ?RECOMMENDATIONS FOR FAMILY MEMBERS:  Relatives in this family might be at some increased risk of developing cancer, over the general population risk, simply due to the family history of cancer.  We recommended female relatives in this family have a yearly mammogram beginning at age 63, or 26 years younger than the earliest onset of cancer, an annual clinical breast exam, and perform monthly breast self-exams. Female relatives in this family should also have a gynecological exam as recommended by their primary  provider.  All family members should be referred for colonoscopy starting at age 46.  ? ? It is also possible there is a hereditary cause for the cancer in Ms. Farrier's family that she did not inherit and therefore was not identified in her.  Based on Ms. Waight's family history, we recommended her aunts who have had breast cancer at young ages have genetic counseling and testing. Ms. Berroa will let us know if we can be of any assistance in coordinating genetic counseling and/or testing for these family members. ? ?FOLLOW-UP: Lastly, we discussed with Ms. Bernales that cancer genetics is a rapidly advancing field and it is possible that new genetic tests will be appropriate for her and/or her family members in the future. We encouraged her to remain in contact with cancer genetics on an annual basis so we can update her personal and family histories and let her know of advances in cancer genetics that may benefit this family.  ? ?Our contact number was provided. Ms. Matus questions were answered to her satisfaction, and she knows she is welcome to call us at anytime with additional questions or concerns.  ? ?Faith Rogue, MS, LCGC ?Genetic Counselor ?[email or web address] ?Phone: [phone] ? ?

## 2021-08-09 NOTE — Anesthesia Postprocedure Evaluation (Signed)
Anesthesia Post Note ? ?Patient: DEANN MCLAINE ? ?Procedure(s) Performed: LAPAROSCOPY DIAGNOSTIC EXCISION OF ENDOMETRIOSIS (Abdomen) ?LAPAROSCOPIC OVARIAN CYSTECTOMY (Bilateral: Abdomen) ?CYSTOSCOPY WITH HYDRODISTENTION (Urethra) ?LYSIS OF ADHESION (Abdomen) ?LAPAROSCOPIC TUBAL LIGATION (Bilateral: Abdomen) ? ?Patient location during evaluation: PACU ?Anesthesia Type: General ?Level of consciousness: awake and alert ?Pain management: pain level controlled ?Vital Signs Assessment: post-procedure vital signs reviewed and stable ?Respiratory status: spontaneous breathing, nonlabored ventilation and respiratory function stable ?Cardiovascular status: blood pressure returned to baseline and stable ?Postop Assessment: no apparent nausea or vomiting ?Anesthetic complications: no ? ? ?No notable events documented. ? ? ?Last Vitals:  ?Vitals:  ? 08/08/21 1453 08/08/21 1545  ?BP: 98/61 (!) 98/57  ?Pulse: 83   ?Resp: 18 20  ?Temp: (!) 36.1 ?C   ?SpO2: 98% 100%  ?  ?Last Pain:  ?Vitals:  ? 08/08/21 1453  ?TempSrc: Temporal  ?PainSc: 0-No pain  ? ? ?  ?  ?  ?  ?  ?  ? ?Foye Deer ? ? ? ? ?

## 2021-08-09 NOTE — Telephone Encounter (Signed)
Revealed negative genetic testing.  This normal result is reassuring.  It is unlikely that there is an increased risk of cancer due to a mutation in one of these genes.  However, genetic testing is not perfect, and cannot definitively rule out a hereditary cause.  It will be important for her to keep in contact with genetics to learn if any additional testing may be needed in the future.      

## 2021-08-23 DIAGNOSIS — Z862 Personal history of diseases of the blood and blood-forming organs and certain disorders involving the immune mechanism: Secondary | ICD-10-CM | POA: Diagnosis not present

## 2021-08-23 DIAGNOSIS — Z8742 Personal history of other diseases of the female genital tract: Secondary | ICD-10-CM | POA: Diagnosis not present

## 2022-03-22 DIAGNOSIS — L988 Other specified disorders of the skin and subcutaneous tissue: Secondary | ICD-10-CM | POA: Diagnosis not present

## 2022-03-22 DIAGNOSIS — D2261 Melanocytic nevi of right upper limb, including shoulder: Secondary | ICD-10-CM | POA: Diagnosis not present

## 2022-03-22 DIAGNOSIS — L570 Actinic keratosis: Secondary | ICD-10-CM | POA: Diagnosis not present

## 2022-03-22 DIAGNOSIS — D225 Melanocytic nevi of trunk: Secondary | ICD-10-CM | POA: Diagnosis not present

## 2022-03-22 DIAGNOSIS — X32XXXA Exposure to sunlight, initial encounter: Secondary | ICD-10-CM | POA: Diagnosis not present

## 2022-03-22 DIAGNOSIS — D2262 Melanocytic nevi of left upper limb, including shoulder: Secondary | ICD-10-CM | POA: Diagnosis not present

## 2022-03-22 DIAGNOSIS — C4361 Malignant melanoma of right upper limb, including shoulder: Secondary | ICD-10-CM | POA: Diagnosis not present

## 2022-03-22 DIAGNOSIS — L821 Other seborrheic keratosis: Secondary | ICD-10-CM | POA: Diagnosis not present

## 2022-03-22 DIAGNOSIS — D485 Neoplasm of uncertain behavior of skin: Secondary | ICD-10-CM | POA: Diagnosis not present

## 2022-03-22 DIAGNOSIS — L814 Other melanin hyperpigmentation: Secondary | ICD-10-CM | POA: Diagnosis not present

## 2022-03-22 DIAGNOSIS — D2272 Melanocytic nevi of left lower limb, including hip: Secondary | ICD-10-CM | POA: Diagnosis not present

## 2022-03-22 DIAGNOSIS — D2271 Melanocytic nevi of right lower limb, including hip: Secondary | ICD-10-CM | POA: Diagnosis not present

## 2022-03-22 DIAGNOSIS — L905 Scar conditions and fibrosis of skin: Secondary | ICD-10-CM | POA: Diagnosis not present

## 2022-04-04 DIAGNOSIS — C4361 Malignant melanoma of right upper limb, including shoulder: Secondary | ICD-10-CM | POA: Diagnosis not present

## 2022-05-11 DIAGNOSIS — R87610 Atypical squamous cells of undetermined significance on cytologic smear of cervix (ASC-US): Secondary | ICD-10-CM | POA: Diagnosis not present

## 2022-05-11 DIAGNOSIS — N9489 Other specified conditions associated with female genital organs and menstrual cycle: Secondary | ICD-10-CM | POA: Diagnosis not present

## 2022-05-11 DIAGNOSIS — Z124 Encounter for screening for malignant neoplasm of cervix: Secondary | ICD-10-CM | POA: Diagnosis not present

## 2022-05-11 DIAGNOSIS — D6851 Activated protein C resistance: Secondary | ICD-10-CM | POA: Diagnosis not present

## 2022-05-11 DIAGNOSIS — C439 Malignant melanoma of skin, unspecified: Secondary | ICD-10-CM | POA: Diagnosis not present

## 2022-05-16 DIAGNOSIS — H66002 Acute suppurative otitis media without spontaneous rupture of ear drum, left ear: Secondary | ICD-10-CM | POA: Diagnosis not present

## 2022-06-07 ENCOUNTER — Encounter: Payer: Self-pay | Admitting: Unknown Physician Specialty

## 2022-06-08 ENCOUNTER — Other Ambulatory Visit: Payer: Self-pay

## 2022-06-08 MED ORDER — CIPROFLOXACIN-DEXAMETHASONE 0.3-0.1 % OT SUSP
4.0000 [drp] | Freq: Two times a day (BID) | OTIC | 0 refills | Status: AC
  Filled 2022-06-08: qty 7.5, 7d supply, fill #0

## 2022-06-09 NOTE — Discharge Instructions (Signed)
[facility] DISCHARGE INSTRUCTIONS FOR MYRINGOTOMY AND TUBE INSERTION  [facility] CHAPMAN T. MCQUEEN, M.D.   Diet:   After surgery, the patient should take only liquids and foods as tolerated.  The patient may then have a regular diet after the effects of anesthesia have worn off, usually about four to six hours after surgery.  Activities:   The patient should rest until the effects of anesthesia have worn off.  After this, there are no restrictions on the normal daily activities.  Medications:   You will be given a prescription for antibiotic drops to be used in the ears postoperatively.  It is recommended to use 4 drops 2 times a day for 7 days, then the drops should be saved for possible future use.  The tubes should not cause any discomfort to the patient, but if there is any question, Tylenol should be given according to the instructions for the age of the patient.  Other medications should be continued normally.  Precautions:   Should there be recurrent drainage after the tubes are placed, the drops should be used for approximately 3-4 days.  If it does not clear, you should call the ENT office.  Earplugs:   Earplugs are only needed for those who are going to be submerged under water.  When taking a bath or shower and using a cup or showerhead to rinse hair, it is not necessary to wear earplugs.  These come in a variety of fashions, all of which can be obtained at our office.  However, if one is not able to come by the office, then silicone plugs can be found at most pharmacies.  It is not advised to stick anything in the ear that is not approved as an earplug.  Silly putty is not to be used as an earplug.  Swimming is allowed in patients after ear tubes are inserted, however, they must wear earplugs if they are going to be submerged under water.  For those children who are going to be swimming a lot, it is recommended to use a fitted ear mold, which can be  made by our audiologist.  If discharge is noticed from the ears, this most likely represents an ear infection.  We would recommend getting your eardrops and using them as indicated above.  If it does not clear, then you should call the ENT office.  For follow up, the patient should return to the ENT office three weeks postoperatively and then every six months as required by the doctor. 

## 2022-06-12 ENCOUNTER — Other Ambulatory Visit: Payer: Self-pay

## 2022-06-16 ENCOUNTER — Ambulatory Visit: Payer: 59 | Admitting: Certified Registered"

## 2022-06-16 ENCOUNTER — Other Ambulatory Visit: Payer: Self-pay

## 2022-06-16 ENCOUNTER — Encounter: Payer: Self-pay | Admitting: Unknown Physician Specialty

## 2022-06-16 ENCOUNTER — Ambulatory Visit
Admission: RE | Admit: 2022-06-16 | Discharge: 2022-06-16 | Disposition: A | Discharge status: Home / Self Care | Payer: 59 | Attending: Unknown Physician Specialty | Admitting: Unknown Physician Specialty

## 2022-06-16 ENCOUNTER — Encounter
Admission: RE | Disposition: A | Discharge status: Home / Self Care | Payer: Self-pay | Source: Home / Self Care | Attending: Unknown Physician Specialty

## 2022-06-16 DIAGNOSIS — T7840XA Allergy, unspecified, initial encounter: Secondary | ICD-10-CM | POA: Diagnosis not present

## 2022-06-16 DIAGNOSIS — J45909 Unspecified asthma, uncomplicated: Secondary | ICD-10-CM | POA: Diagnosis not present

## 2022-06-16 DIAGNOSIS — H663X2 Other chronic suppurative otitis media, left ear: Secondary | ICD-10-CM | POA: Diagnosis not present

## 2022-06-16 DIAGNOSIS — H66002 Acute suppurative otitis media without spontaneous rupture of ear drum, left ear: Secondary | ICD-10-CM | POA: Insufficient documentation

## 2022-06-16 DIAGNOSIS — H6522 Chronic serous otitis media, left ear: Secondary | ICD-10-CM | POA: Diagnosis not present

## 2022-06-16 DIAGNOSIS — Z87891 Personal history of nicotine dependence: Secondary | ICD-10-CM | POA: Diagnosis not present

## 2022-06-16 HISTORY — DX: Motion sickness, initial encounter: T75.3XXA

## 2022-06-16 HISTORY — DX: Genetic susceptibility to other disease: Z15.89

## 2022-06-16 HISTORY — PX: MYRINGOTOMY WITH TUBE PLACEMENT: SHX5663

## 2022-06-16 SURGERY — MYRINGOTOMY WITH TUBE PLACEMENT
Anesthesia: General | Site: Ear | Laterality: Left

## 2022-06-16 MED ORDER — CIPROFLOXACIN-DEXAMETHASONE 0.3-0.1 % OT SUSP
OTIC | Status: DC | PRN
  Administered 2022-06-16: 1 [drp]

## 2022-06-16 MED ORDER — FENTANYL CITRATE (PF) 100 MCG/2ML IJ SOLN
INTRAMUSCULAR | Status: DC | PRN
  Administered 2022-06-16: 50 ug via INTRAVENOUS

## 2022-06-16 MED ORDER — MIDAZOLAM HCL 2 MG/2ML IJ SOLN
INTRAMUSCULAR | Status: DC | PRN
  Administered 2022-06-16: 2 mg via INTRAVENOUS

## 2022-06-16 MED ORDER — PROPOFOL 10 MG/ML IV BOLUS
INTRAVENOUS | Status: DC | PRN
  Administered 2022-06-16: 100 mg via INTRAVENOUS

## 2022-06-16 MED ORDER — LIDOCAINE HCL (CARDIAC) PF 100 MG/5ML IV SOSY
PREFILLED_SYRINGE | INTRAVENOUS | Status: DC | PRN
  Administered 2022-06-16: 50 mg via INTRAVENOUS

## 2022-06-16 MED ORDER — LACTATED RINGERS IV SOLN: INTRAVENOUS | Status: DC

## 2022-06-16 SURGICAL SUPPLY — 10 items
BALL CTTN LRG ABS STRL LF (GAUZE/BANDAGES/DRESSINGS) ×1
BLADE MYR LANCE NRW W/HDL (BLADE) IMPLANT
CANISTER SUCT 1200ML W/VALVE (MISCELLANEOUS) ×2 IMPLANT
COTTONBALL LRG STERILE PKG (GAUZE/BANDAGES/DRESSINGS) ×2 IMPLANT
GLOVE SURG ENC TEXT LTX SZ7.5 (GLOVE) ×2 IMPLANT
STRAP BODY AND KNEE 60X3 (MISCELLANEOUS) ×2 IMPLANT
TOWEL OR 17X26 4PK STRL BLUE (TOWEL DISPOSABLE) ×2 IMPLANT
TUBE EAR T 1.27X5.3 BFLY (OTOLOGIC RELATED) IMPLANT
TUBING CONN 6MMX3.1M (TUBING) ×1
TUBING SUCTION CONN 0.25 STRL (TUBING) ×2 IMPLANT

## 2022-06-16 NOTE — Anesthesia Preprocedure Evaluation (Signed)
Anesthesia Evaluation  Patient identified by MRN, date of birth, ID band Patient awake    Reviewed: Allergy & Precautions, NPO status , Patient's Chart, lab work & pertinent test results  Airway Mallampati: III  TM Distance: >3 FB Neck ROM: full    Dental  (+) Chipped   Pulmonary neg pulmonary ROS, neg shortness of breath, asthma , former smoker   Pulmonary exam normal        Cardiovascular (-) angina (-) Past MI and (-) CABG negative cardio ROS Normal cardiovascular exam     Neuro/Psych negative neurological ROS  negative psych ROS   GI/Hepatic negative GI ROS, Neg liver ROS,,,  Endo/Other  negative endocrine ROS    Renal/GU negative Renal ROS  negative genitourinary   Musculoskeletal   Abdominal   Peds  Hematology negative hematology ROS (+)   Anesthesia Other Findings Past Medical History: No date: Asthma     Comment:  cold weather 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) No date: Family history of breast cancer No date: Family history of melanoma No date: Motion sickness     Comment:  curvy roads No date: MTHFR mutation No date: Vitamin D deficiency  Past Surgical History: 08/08/2021: CYSTOSCOPY; N/A     Comment:  Procedure: CYSTOSCOPY WITH HYDRODISTENTION;  Surgeon:               Benjaman Kindler, MD;  Location: [facility] ORS;  Service:               Gynecology;  Laterality: N/A; No date: DILATION AND CURETTAGE OF UTERUS No date: LAPAROSCOPIC ABDOMINAL EXPLORATION 08/08/2021: LAPAROSCOPIC OVARIAN CYSTECTOMY; Bilateral     Comment:  Procedure: LAPAROSCOPIC OVARIAN CYSTECTOMY;  Surgeon:               Benjaman Kindler, MD;  Location: [facility] ORS;  Service:               Gynecology;  Laterality: Bilateral; 08/08/2021: LAPAROSCOPIC TUBAL LIGATION; Bilateral     Comment:  Procedure: LAPAROSCOPIC TUBAL LIGATION;  Surgeon:               Benjaman Kindler, MD;  Location: [facility] ORS;  Service:               Gynecology;   Laterality: Bilateral; 08/08/2021: LAPAROSCOPY; N/A     Comment:  Procedure: LAPAROSCOPY DIAGNOSTIC EXCISION OF               ENDOMETRIOSIS;  Surgeon: Benjaman Kindler, MD;  Location:              [facility] ORS;  Service: Gynecology;  Laterality: N/A; 08/08/2021: LYSIS OF ADHESION; N/A     Comment:  Procedure: LYSIS OF ADHESION;  Surgeon: Benjaman Kindler, MD;  Location: [facility] ORS;  Service: Gynecology;                Laterality: N/A; No date: WISDOM TOOTH EXTRACTION  BMI    Body Mass Index: 28.72 kg/m      Reproductive/Obstetrics negative OB ROS                             Anesthesia Physical Anesthesia Plan  ASA: 2  Anesthesia Plan: General   Post-op Pain Management: Minimal or no pain anticipated   Induction: Intravenous  PONV Risk Score and Plan: 3 and Propofol infusion, TIVA and Ondansetron  Airway Management  Planned: Nasal Cannula  Additional Equipment: None  Intra-op Plan:   Post-operative Plan:   Informed Consent: I have reviewed the patients History and Physical, chart, labs and discussed the procedure including the risks, benefits and alternatives for the proposed anesthesia with the patient or authorized representative who has indicated his/her understanding and acceptance.     Dental advisory given  Plan Discussed with: CRNA and Surgeon  Anesthesia Plan Comments: (Discussed risks of anesthesia with patient, including possibility of difficulty with spontaneous ventilation under anesthesia necessitating airway intervention, PONV, and rare risks such as cardiac or respiratory or neurological events, and allergic reactions. Discussed the role of CRNA in patient's perioperative care. Patient understands.)       Anesthesia Quick Evaluation

## 2022-06-16 NOTE — Op Note (Signed)
06/16/2022  8:32 AM    Theola Sequin  [id number]   Pre-Op Dx: Acute purulent otitis media, Left  Post-op Dx: SAME  Proc: Left myringotomy and butterfly tube placement  Surg:  Roena Malady  Anes:  GOT  EBL: 0  Comp: None  Findings: Clear middle ear  Procedure: Roselyn Reef was identified in the holding area taken the operating placed supine position.  After general mask anesthesia the operating microscope was brought in the field the left-hand side was examined the ear canal was normal.  An inferior myringotomy was performed there was no evidence of fluid in the middle ear space.  A butterfly tube was placed without difficulty.  Ciprodex drops were then instilled in the external canal followed by cottonball.  Patient was then awakened in the operating room taken covered in stable condition  Dispo:   Kermit Balo  Plan: Charged home follow-up 3 weeks  Roena Malady  06/16/2022 8:32 AM

## 2022-06-16 NOTE — H&P (Signed)
The patient's history has been reviewed, patient examined, no change in status, stable for surgery.  Questions were answered to the patients satisfaction.  

## 2022-06-16 NOTE — Transfer of Care (Signed)
Immediate Anesthesia Transfer of Care Note  Patient: Joyce Lee  Procedure(s) Performed: MYRINGOTOMY WITH TUBE PLACEMENT WITH BUTTERFLY TUBE (Left: Ear)  Patient Location: PACU  Anesthesia Type: General  Level of Consciousness: awake, alert  and patient cooperative  Airway and Oxygen Therapy: Patient Spontanous Breathing and Patient connected to supplemental oxygen  Post-op Assessment: Post-op Vital signs reviewed, Patient's Cardiovascular Status Stable, Respiratory Function Stable, Patent Airway and No signs of Nausea or vomiting  Post-op Vital Signs: Reviewed and stable  Complications: No notable events documented.

## 2022-06-16 NOTE — Anesthesia Postprocedure Evaluation (Signed)
Anesthesia Post Note  Patient: Joyce Lee  Procedure(s) Performed: MYRINGOTOMY WITH TUBE PLACEMENT WITH BUTTERFLY TUBE (Left: Ear)  Patient location during evaluation: PACU Anesthesia Type: General Level of consciousness: awake and alert Pain management: pain level controlled Vital Signs Assessment: post-procedure vital signs reviewed and stable Respiratory status: spontaneous breathing, nonlabored ventilation, respiratory function stable and patient connected to nasal cannula oxygen Cardiovascular status: blood pressure returned to baseline and stable Postop Assessment: no apparent nausea or vomiting Anesthetic complications: no  No notable events documented.   Last Vitals:  Vitals:   06/16/22 0845 06/16/22 0851  BP: (!) 103/50   Pulse: 84 68  Resp: 13 13  Temp:    SpO2: 96% 97%    Last Pain:  Vitals:   06/16/22 0851  TempSrc:   PainSc: 0-No pain                 Dimas Millin

## 2022-06-19 ENCOUNTER — Encounter: Payer: Self-pay | Admitting: Unknown Physician Specialty

## 2022-07-03 DIAGNOSIS — N809 Endometriosis, unspecified: Secondary | ICD-10-CM | POA: Diagnosis not present

## 2022-07-06 DIAGNOSIS — M5387 Other specified dorsopathies, lumbosacral region: Secondary | ICD-10-CM | POA: Diagnosis not present

## 2022-07-31 DIAGNOSIS — D225 Melanocytic nevi of trunk: Secondary | ICD-10-CM | POA: Diagnosis not present

## 2022-07-31 DIAGNOSIS — D2261 Melanocytic nevi of right upper limb, including shoulder: Secondary | ICD-10-CM | POA: Diagnosis not present

## 2022-07-31 DIAGNOSIS — Z85820 Personal history of malignant melanoma of skin: Secondary | ICD-10-CM | POA: Diagnosis not present

## 2022-07-31 DIAGNOSIS — D2262 Melanocytic nevi of left upper limb, including shoulder: Secondary | ICD-10-CM | POA: Diagnosis not present

## 2022-07-31 DIAGNOSIS — L728 Other follicular cysts of the skin and subcutaneous tissue: Secondary | ICD-10-CM | POA: Diagnosis not present

## 2022-09-13 DIAGNOSIS — D485 Neoplasm of uncertain behavior of skin: Secondary | ICD-10-CM | POA: Diagnosis not present

## 2022-09-13 DIAGNOSIS — Z85820 Personal history of malignant melanoma of skin: Secondary | ICD-10-CM | POA: Diagnosis not present

## 2022-09-13 DIAGNOSIS — L814 Other melanin hyperpigmentation: Secondary | ICD-10-CM | POA: Diagnosis not present

## 2023-01-17 DIAGNOSIS — L570 Actinic keratosis: Secondary | ICD-10-CM | POA: Diagnosis not present

## 2023-01-17 DIAGNOSIS — Z85820 Personal history of malignant melanoma of skin: Secondary | ICD-10-CM | POA: Diagnosis not present

## 2023-01-17 DIAGNOSIS — L814 Other melanin hyperpigmentation: Secondary | ICD-10-CM | POA: Diagnosis not present

## 2023-02-12 ENCOUNTER — Other Ambulatory Visit: Payer: Self-pay | Admitting: Obstetrics and Gynecology

## 2023-02-12 DIAGNOSIS — D485 Neoplasm of uncertain behavior of skin: Secondary | ICD-10-CM | POA: Diagnosis not present

## 2023-02-12 DIAGNOSIS — C44219 Basal cell carcinoma of skin of left ear and external auricular canal: Secondary | ICD-10-CM | POA: Diagnosis not present

## 2023-02-12 DIAGNOSIS — L814 Other melanin hyperpigmentation: Secondary | ICD-10-CM | POA: Diagnosis not present

## 2023-02-12 DIAGNOSIS — L700 Acne vulgaris: Secondary | ICD-10-CM | POA: Diagnosis not present

## 2023-02-12 DIAGNOSIS — L821 Other seborrheic keratosis: Secondary | ICD-10-CM | POA: Diagnosis not present

## 2023-02-12 DIAGNOSIS — L570 Actinic keratosis: Secondary | ICD-10-CM | POA: Diagnosis not present

## 2023-02-12 DIAGNOSIS — D225 Melanocytic nevi of trunk: Secondary | ICD-10-CM | POA: Diagnosis not present

## 2023-02-12 DIAGNOSIS — Z1231 Encounter for screening mammogram for malignant neoplasm of breast: Secondary | ICD-10-CM

## 2023-02-12 DIAGNOSIS — D2271 Melanocytic nevi of right lower limb, including hip: Secondary | ICD-10-CM | POA: Diagnosis not present

## 2023-02-12 DIAGNOSIS — Z85820 Personal history of malignant melanoma of skin: Secondary | ICD-10-CM | POA: Diagnosis not present

## 2023-02-21 ENCOUNTER — Ambulatory Visit
Admission: RE | Admit: 2023-02-21 | Discharge: 2023-02-21 | Disposition: A | Discharge status: Home / Self Care | Payer: 59 | Source: Ambulatory Visit | Attending: Obstetrics and Gynecology | Admitting: Obstetrics and Gynecology

## 2023-02-21 DIAGNOSIS — Z1231 Encounter for screening mammogram for malignant neoplasm of breast: Secondary | ICD-10-CM | POA: Diagnosis not present

## 2023-03-05 DIAGNOSIS — D485 Neoplasm of uncertain behavior of skin: Secondary | ICD-10-CM | POA: Diagnosis not present

## 2023-03-05 DIAGNOSIS — Z85820 Personal history of malignant melanoma of skin: Secondary | ICD-10-CM | POA: Diagnosis not present

## 2023-03-05 DIAGNOSIS — L821 Other seborrheic keratosis: Secondary | ICD-10-CM | POA: Diagnosis not present

## 2023-03-22 DIAGNOSIS — C44219 Basal cell carcinoma of skin of left ear and external auricular canal: Secondary | ICD-10-CM | POA: Diagnosis not present

## 2023-03-22 DIAGNOSIS — Z85828 Personal history of other malignant neoplasm of skin: Secondary | ICD-10-CM | POA: Diagnosis not present

## 2023-03-23 DIAGNOSIS — N80209 Endometriosis of unspecified fallopian tube, unspecified depth: Secondary | ICD-10-CM | POA: Diagnosis not present

## 2023-05-18 DIAGNOSIS — J069 Acute upper respiratory infection, unspecified: Secondary | ICD-10-CM | POA: Diagnosis not present

## 2023-05-19 ENCOUNTER — Telehealth: Payer: 59 | Admitting: Family Medicine

## 2023-05-19 DIAGNOSIS — J069 Acute upper respiratory infection, unspecified: Secondary | ICD-10-CM | POA: Diagnosis not present

## 2023-05-19 MED ORDER — BENZONATATE 100 MG PO CAPS: 100.0000 mg | ORAL_CAPSULE | Freq: Three times a day (TID) | ORAL | 0 refills | Status: AC | PRN

## 2023-05-19 MED ORDER — FLUTICASONE PROPIONATE 50 MCG/ACT NA SUSP: 2.0000 | Freq: Every day | NASAL | 0 refills | Status: AC

## 2023-05-19 NOTE — Progress Notes (Signed)
E-Visit for Upper Respiratory Infection   We are sorry you are not feeling well.  Here is how we plan to help!    Based on what you have shared with me, it looks like you may have a viral upper respiratory infection.  Upper respiratory infections are caused by a large number of viruses; however, rhinovirus is the most common cause.  If you believe it is worsening, please visit an urgent care or schedule a virtual visit so that you can be re evaluated.  Below you will find a list of urgent cares in your area.   Symptoms vary from person to person, with common symptoms including sore throat, cough, fatigue or lack of energy and feeling of general discomfort.  A low-grade fever of up to 100.4 may present, but is often uncommon.  Symptoms vary however, and are closely related to a person's age or underlying illnesses.  The most common symptoms associated with an upper respiratory infection are nasal discharge or congestion, cough, sneezing, headache and pressure in the ears and face.  These symptoms usually persist for about 3 to 10 days, but can last up to 2 weeks.  It is important to know that upper respiratory infections do not cause serious illness or complications in most cases.    Upper respiratory infections can be transmitted from person to person, with the most common method of transmission being a person's hands.  The virus is able to live on the skin and can infect other persons for up to 2 hours after direct contact.  Also, these can be transmitted when someone coughs or sneezes; thus, it is important to cover the mouth to reduce this risk.  To keep the spread of the illness at bay, good hand hygiene is very important.  This is an infection that is most likely caused by a virus. There are no specific treatments other than to help you with the symptoms until the infection runs its course.  We are sorry you are not feeling well.  Here is how we plan to help!   For nasal congestion, you may use an  oral decongestants such as Mucinex D or if you have glaucoma or high blood pressure use plain Mucinex.  Saline nasal spray or nasal drops can help and can safely be used as often as needed for congestion.  For your congestion, I have prescribed Fluticasone nasal spray one spray in each nostril twice a day  If you do not have a history of heart disease, hypertension, diabetes or thyroid disease, prostate/bladder issues or glaucoma, you may also use Sudafed to treat nasal congestion.  It is highly recommended that you consult with a pharmacist or your primary care physician to ensure this medication is safe for you to take.     If you have a cough, you may use cough suppressants such as Delsym and Robitussin.  If you have glaucoma or high blood pressure, you can also use Coricidin HBP.   For cough I have prescribed for you A prescription cough medication called Tessalon Perles 100 mg. You may take 1-2 capsules every 8 hours as needed for cough  If you have a sore or scratchy throat, use a saltwater gargle-  to  teaspoon of salt dissolved in a 4-ounce to 8-ounce glass of warm water.  Gargle the solution for approximately 15-30 seconds and then spit.  It is important not to swallow the solution.  You can also use throat lozenges/cough drops and Chloraseptic spray to  help with throat pain or discomfort.  Warm or cold liquids can also be helpful in relieving throat pain.  For headache, pain or general discomfort, you can use Ibuprofen or Tylenol as directed.   Some authorities believe that zinc sprays or the use of Echinacea may shorten the course of your symptoms.   HOME CARE Only take medications as instructed by your medical team. Be sure to drink plenty of fluids. Water is fine as well as fruit juices, sodas and electrolyte beverages. You may want to stay away from caffeine or alcohol. If you are nauseated, try taking small sips of liquids. How do you know if you are getting enough fluid? Your urine  should be a pale yellow or almost colorless. Get rest. Taking a steamy shower or using a humidifier may help nasal congestion and ease sore throat pain. You can place a towel over your head and breathe in the steam from hot water coming from a faucet. Using a saline nasal spray works much the same way. Cough drops, hard candies and sore throat lozenges may ease your cough. Avoid close contacts especially the very young and the elderly Cover your mouth if you cough or sneeze Always remember to wash your hands.   GET HELP RIGHT AWAY IF: You develop worsening fever. If your symptoms do not improve within 10 days You develop yellow or green discharge from your nose over 3 days. You have coughing fits You develop a severe head ache or visual changes. You develop shortness of breath, difficulty breathing or start having chest pain Your symptoms persist after you have completed your treatment plan  MAKE SURE YOU  Understand these instructions. Will watch your condition. Will get help right away if you are not doing well or get worse.  Thank you for choosing an e-visit.  Your e-visit answers were reviewed by a board certified advanced clinical practitioner to complete your personal care plan. Depending upon the condition, your plan could have included both over the counter or prescription medications.  Please review your pharmacy choice. Make sure the pharmacy is open so you can pick up prescription now. If there is a problem, you may contact your provider through [employer] and have the prescription routed to another pharmacy.  Your safety is important to [place]. If you have drug allergies check your prescription carefully.   For the next 24 hours you can use MyChart to ask questions about today's visit, request a non-urgent call back, or ask for a work or school excuse. You will get an email in the next two days asking about your experience. I hope that your e-visit has been valuable and  will speed your recovery.  [facility]  [facility] at [facility]  [phone]  [address]  [place], [place] [address]  [facility] Urgent [facility])  [phone]  [address]  [place], [place] [address]  [facility] Urgent [facility])  [phone]  [address]  [place], [place] [address]  [facility]  [phone] [address]  [place], [place] [address]  [facility] at [facility]  [phone]  [address]  [address]  [place], [place] [address]  [facility] [place]  [phone]  [address]  [place], [place] [address]  [facility] Emergency Departments  Emergency [facility]-[place] [facility]  [phone]  [address]  [place], [place] [address]  open 24/7/365  [facility] Emergency Department at [facility]  [phone]  [address]  [place], [place] [address]  open 24/7/365    I have spent 5 minutes in review of e-visit questionnaire, review and updating patient chart, medical decision making and response to patient.   Reed Pandy, PA-C

## 2024-01-28 ENCOUNTER — Other Ambulatory Visit: Payer: Self-pay | Admitting: Obstetrics and Gynecology

## 2024-01-28 DIAGNOSIS — Z1231 Encounter for screening mammogram for malignant neoplasm of breast: Secondary | ICD-10-CM

## 2024-02-22 ENCOUNTER — Ambulatory Visit
Admission: RE | Admit: 2024-02-22 | Discharge: 2024-02-22 | Disposition: A | Discharge status: Home / Self Care | Source: Ambulatory Visit | Attending: Obstetrics and Gynecology | Admitting: Obstetrics and Gynecology

## 2024-02-22 DIAGNOSIS — Z1231 Encounter for screening mammogram for malignant neoplasm of breast: Secondary | ICD-10-CM | POA: Diagnosis present
